# Patient Record
Sex: Female | Born: 1985 | Race: Black or African American | Hispanic: No | Marital: Married | Smoking: Current every day smoker
Health system: Southern US, Community
[De-identification: ages and names within clinical notes are randomized; demographics above are authoritative.]

## PROBLEM LIST (undated history)

## (undated) ENCOUNTER — Inpatient Hospital Stay (HOSPITAL_COMMUNITY): Payer: Self-pay

## (undated) DIAGNOSIS — O139 Gestational [pregnancy-induced] hypertension without significant proteinuria, unspecified trimester: Secondary | ICD-10-CM

## (undated) DIAGNOSIS — D573 Sickle-cell trait: Secondary | ICD-10-CM

## (undated) HISTORY — DX: Sickle-cell trait: D57.3

---
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
## 2004-07-21 HISTORY — PX: OVARIAN CYST REMOVAL: SHX89

## 2004-08-01 ENCOUNTER — Other Ambulatory Visit: Admission: RE | Admit: 2004-08-01 | Discharge: 2004-08-01 | Payer: Self-pay | Admitting: Obstetrics and Gynecology

## 2004-11-19 ENCOUNTER — Inpatient Hospital Stay (HOSPITAL_COMMUNITY): Admission: AD | Admit: 2004-11-19 | Discharge: 2004-11-19 | Payer: Self-pay | Admitting: Obstetrics and Gynecology

## 2004-12-03 ENCOUNTER — Ambulatory Visit: Payer: Self-pay | Admitting: Cardiovascular Disease

## 2004-12-12 ENCOUNTER — Inpatient Hospital Stay (HOSPITAL_COMMUNITY): Admission: AD | Admit: 2004-12-12 | Discharge: 2004-12-12 | Payer: Self-pay | Admitting: Obstetrics and Gynecology

## 2005-01-06 ENCOUNTER — Ambulatory Visit: Payer: Self-pay

## 2005-02-06 ENCOUNTER — Inpatient Hospital Stay (HOSPITAL_COMMUNITY): Admission: AD | Admit: 2005-02-06 | Discharge: 2005-02-10 | Payer: Self-pay | Admitting: Obstetrics and Gynecology

## 2005-02-07 ENCOUNTER — Encounter (INDEPENDENT_AMBULATORY_CARE_PROVIDER_SITE_OTHER): Payer: Self-pay | Admitting: *Deleted

## 2005-09-03 ENCOUNTER — Emergency Department (HOSPITAL_COMMUNITY): Admission: EM | Admit: 2005-09-03 | Discharge: 2005-09-03 | Payer: Self-pay | Admitting: Emergency Medicine

## 2005-11-27 ENCOUNTER — Emergency Department (HOSPITAL_COMMUNITY): Admission: EM | Admit: 2005-11-27 | Discharge: 2005-11-28 | Payer: Self-pay | Admitting: Emergency Medicine

## 2006-07-18 ENCOUNTER — Inpatient Hospital Stay (HOSPITAL_COMMUNITY): Admission: AD | Admit: 2006-07-18 | Discharge: 2006-07-18 | Payer: Self-pay | Admitting: Obstetrics and Gynecology

## 2007-09-08 ENCOUNTER — Emergency Department (HOSPITAL_COMMUNITY): Admission: EM | Admit: 2007-09-08 | Discharge: 2007-09-08 | Payer: Self-pay | Admitting: Emergency Medicine

## 2009-12-11 ENCOUNTER — Emergency Department (HOSPITAL_COMMUNITY): Admission: EM | Admit: 2009-12-11 | Discharge: 2009-12-11 | Payer: Self-pay | Admitting: Emergency Medicine

## 2010-02-04 ENCOUNTER — Emergency Department (HOSPITAL_COMMUNITY): Admission: EM | Admit: 2010-02-04 | Discharge: 2010-02-04 | Payer: Self-pay | Admitting: Emergency Medicine
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
## 2010-12-06 NOTE — Discharge Summary (Signed)
NAMEEDOM, SCHMUHL                  ACCOUNT NO.:  [id number]   MEDICAL RECORD NO.:  [id number]          PATIENT TYPE:  INP   LOCATION:  9374                          FACILITY:  [facility]   PHYSICIAN:  Crist Fat. Rivard, M.D. DATE OF BIRTH:  [birth year]   DATE OF ADMISSION:  02/06/2005  DATE OF DISCHARGE:  02/10/2005                                 DISCHARGE SUMMARY   ADMISSION DIAGNOSES:  1.  Intrauterine pregnancy at 36 weeks.  2.  Preeclampsia.  3.  Positive group B Strep.   DISCHARGE DIAGNOSES:  1.  Intrauterine pregnancy at 36 weeks.  2.  Preeclampsia.  3.  Positive group B Strep.  4.  Right adnexal cyst.  5.  Nonreassuring fetal heart rate tracing.   HOSPITAL PROCEDURES:  1.  Electronic fetal monitoring.  2.  Magnesium sulfate administration.  3.  Cervidil and Pitocin induction of labor.  4.  Epidural anesthesia.  5.  Primary low transverse cesarean section of a viable female infant.      Apgars 7 and 7.  6.  Removal of large right ovarian cyst.  7.  ICU monitoring.   HOSPITAL COURSE:  Patient was admitted for preeclampsia.  Cervidil was  placed due to unfavorable cervix.  She was later started on Pitocin and  given penicillin for group B Strep prophylaxis.  She developed decreased  variability on her fetal heart rate tracing.  Her cervix progressed only to  3 cm.  A decision was made to proceed with cesarean section for  nonreassuring fetal heart rate tracing.  This was performed under epidural  anesthesia by Dr. Normand Sloop.  A large right ovarian cyst approximately 8 cm  was also removed after the baby was delivered.  The baby was a female  infant, Apgars 7 and 7.  Cord pH was 7.32.  Baby was taken to the nursery,  mother taken to recovery in AICU.  On postoperative day #1 blood pressures  were 140-165/90-110.  She was given routine AICU care on magnesium sulfate.  Magnesium sulfate was discontinued on postoperative day #2.  Her laboratory  values improved.  Her blood  pressures were 130s/70s-80s and she continued to  receive routine care.  Magnesium sulfate was discontinued that evening.  On  postoperative day #3 patient was requesting to go home.  Blood pressures  were 132-155/90-108 on 100 mg of labetalol twice per day.  Weight was 204  reflecting no diuresis yet.  White blood cell count 11.2, hemoglobin 7.4.  Platelets were 124 which is up from 93.  Oxygen saturations were 93-100%.  Intake and output revealed diuresis of approximately 2 L.  DTRs were 1+.  Edema was 2+ and her other laboratory values were within normal limits.  Patient was deemed to have received the full benefit of her hospital stay  and was discharged home.   DISCHARGE MEDICATIONS:  1.  Motrin 600 mg p.o. q.6h. p.r.n.  2.  Tylox one to two p.o. q.4h. p.r.n.  3.  Labetalol will be prescribed prior to discharge, but dosage to be      determined  by Dr. Estanislado Pandy.   DISCHARGE INSTRUCTIONS:  Per [facility] handout.   DICSHARGE FOLLOW-UP:  In one week for blood pressure check.   DISCHARGE LABORATORIES:  White blood cell count 11.2, hemoglobin 7.4,  platelets 124.  Sodium 138, potassium 4, creatinine 1.2.  SGOT 30, SGPT 18,  uric acid 8.4.   CONDITION ON DISCHARGE:  Good.       MLW/MEDQ  D:  02/10/2005  T:  02/10/2005  Job:  [id number]

## 2010-12-06 NOTE — Discharge Summary (Signed)
   NAMECASH, MEADOW NO.:  [id number]   MEDICAL RECORD NO.:  [id number]                   PATIENT TYPE:  EMS   LOCATION:  ED                                   FACILITY:  [facility]   PHYSICIAN:  Danae Orleans. Venetia Maxon, M.D.               DATE OF BIRTH:  09-Aug-1985   DATE OF ADMISSION:  05/02/2002  DATE OF DISCHARGE:  05/03/2002                                 DISCHARGE SUMMARY   DEATH SUMMARY:  Dictation ended at this point.                                                Danae Orleans. Venetia Maxon, M.D.    JDS/MEDQ  D:  05/20/2002  T:  05/22/2002  Job:  [id number]

## 2010-12-06 NOTE — Op Note (Signed)
[name], [name]                  ACCOUNT NO.:  [id number]   MEDICAL RECORD NO.:  [id number]          PATIENT TYPE:  INP   LOCATION:  9374                          [facility]   PHYSICIAN:  [name], M.D. DATE OF BIRTH:  [birth year]   DATE OF PROCEDURE:  02/07/2005  DATE OF DISCHARGE:                                 OPERATIVE REPORT   PREOPERATIVE DIAGNOSES:  1.  Intrauterine pregnancy at 36 weeks.  2.  Preeclampsia.  3.  Right ovarian cyst.  4.  Nonreassuring fetal heart tones.   POSTOPERATIVE DIAGNOSES:  1.  Intrauterine pregnancy at 36 weeks.  2.  Preeclampsia.  3.  Right ovarian cyst.  4.  Nonreassuring fetal heart tones.   OPERATION/PROCEDURE:  1.  Right ovarian cystectomy.  2.  Lysis of adhesions.  3.  Primary lower transverse cesarean section.   SURGEON:  Ann A. Normand Sloop, M.D.   ASSISTANT:  Elby Showers. Williams, C.N.M.   ESTIMATED BLOOD LOSS:  800 mL.   IV FLUIDS:  1500 mL crystalloids.   URINARY OUTPUT:  500 mL clear urine at the procedure.   FINDINGS:  Female infant, vertex presentation with Apgars of 7 and 7 and  cord PH of 7.32.  Normal-appearing appendix.  Large right simple ovarian  cyst.  Normal-appearing right ovary.  Normal appearing left tube and ovary.  There were no complications.  The patient went to the recovery room in good  condition.   DESCRIPTION OF PROCEDURE:  The patient was taken to the operating room where  she had anesthesia by epidural, where her epidural was found to be adequate.  She was prepped and draped in the normal sterile fashion.  A Pfannenstiel  skin incision was made with the scalpel and carried down to the fascia using  Bovie cautery.  The fascia was incised in the midline and extended  bilaterally using Mayo scissors and pickups with teeth.  Kochers x2 were  placed on the superior aspect of the fascia which was dissected off the  rectus muscles both sharply and bluntly in the inferior aspect of the fascia  was  dissected  in a similar fashion.  The rectus muscle was separated.  The  peritoneum was identified and cavity entered sharply.  The bladder blade was  inserted.  The vesicouterine peritoneum was identified, tented up and  entered sharply and extended bilaterally.  Bladder blade was reinserted.  The primary lower transverse uterine incision was made with the scalpel and  extended  bilaterally transversely bilaterally bluntly.  The infant was  delivered without difficulty.  Cord gas was obtained.  The placenta was  delivered intact and sent to pathology.  The uterus was cleared of clot and  debris.  Uterine incision was repaired with 0 Vicryl in a running locked  fashion, second layer with 0 Vicryl was used to imbricate the uterus.  The  uterus was then exteriorized and the right ovary was adherent to the  posterior of the uterus with adhesions.  Lysis of adhesions were done with  Metzenbaum scissors.  The right cyst wall was  then opened and drained. Clear  straw fluid was noted.  Most of the cyst was removed and the cyst wall was  removed from the ovarian tissue.  Before the cyst was removed, however, the  fallopian tube was adherent and the fallopian tube was dissected away from  the cyst.  The cyst wall was removed from the remaining ovarian tissue.  The  ovarian tissue was made hemostatic using 3-0 Vicryl in a running locked  fashion.  Irrigation was done.  Hemostasis was assured.  Uterus was returned  back to the abdomen.  Irrigation was done once again.  All instruments were  removed from the abdomen. The peritoneum was closed with 0 chromic.  The  fascia was closed with 0 Vicryl in a running fashion.  Subcutaneous tissue  was made hemostatic with Bovie cautery.  Jackson-Pratt drain was placed.  Skin was closed with staples.  Sponge, lab and needle counts were correct  x2.  The patient was taken to the recovery room in stable condition.       NAD/MEDQ  D:  02/07/2005  T:  02/07/2005   Job:  [id number]

## 2011-07-22 ENCOUNTER — Inpatient Hospital Stay (HOSPITAL_COMMUNITY): Payer: Medicaid Other

## 2011-07-22 ENCOUNTER — Inpatient Hospital Stay (HOSPITAL_COMMUNITY)
Admission: AD | Admit: 2011-07-22 | Discharge: 2011-07-22 | Disposition: A | Discharge status: Home / Self Care | Payer: Medicaid Other | Source: Ambulatory Visit | Attending: Obstetrics & Gynecology | Admitting: Obstetrics & Gynecology

## 2011-07-22 ENCOUNTER — Encounter (HOSPITAL_COMMUNITY): Payer: Self-pay

## 2011-07-22 DIAGNOSIS — O26859 Spotting complicating pregnancy, unspecified trimester: Secondary | ICD-10-CM | POA: Insufficient documentation

## 2011-07-22 DIAGNOSIS — R1031 Right lower quadrant pain: Secondary | ICD-10-CM | POA: Insufficient documentation

## 2011-07-22 DIAGNOSIS — O2390 Unspecified genitourinary tract infection in pregnancy, unspecified trimester: Secondary | ICD-10-CM | POA: Insufficient documentation

## 2011-07-22 DIAGNOSIS — N760 Acute vaginitis: Secondary | ICD-10-CM | POA: Insufficient documentation

## 2011-07-22 DIAGNOSIS — O4690 Antepartum hemorrhage, unspecified, unspecified trimester: Secondary | ICD-10-CM

## 2011-07-22 DIAGNOSIS — O26899 Other specified pregnancy related conditions, unspecified trimester: Secondary | ICD-10-CM

## 2011-07-22 DIAGNOSIS — B9689 Other specified bacterial agents as the cause of diseases classified elsewhere: Secondary | ICD-10-CM | POA: Insufficient documentation

## 2011-07-22 DIAGNOSIS — A499 Bacterial infection, unspecified: Secondary | ICD-10-CM | POA: Insufficient documentation

## 2011-07-22 HISTORY — DX: Gestational (pregnancy-induced) hypertension without significant proteinuria, unspecified trimester: O13.9

## 2011-07-22 LAB — CBC
HCT: 31.3 % — ABNORMAL LOW (ref 36.0–46.0)
MCH: 23.3 pg — ABNORMAL LOW (ref 26.0–34.0)
MCV: 73 fL — ABNORMAL LOW (ref 78.0–100.0)
WBC: 12.2 10*3/uL — ABNORMAL HIGH (ref 4.0–10.5)

## 2011-07-22 LAB — URINALYSIS, ROUTINE W REFLEX MICROSCOPIC
Leukocytes, UA: NEGATIVE
Nitrite: NEGATIVE
Protein, ur: NEGATIVE mg/dL
Urobilinogen, UA: 0.2 mg/dL (ref 0.0–1.0)

## 2011-07-22 LAB — WET PREP, GENITAL
Trich, Wet Prep: NONE SEEN
Yeast Wet Prep HPF POC: NONE SEEN

## 2011-07-22 MED ORDER — METRONIDAZOLE 500 MG PO TABS: 500.0000 mg | ORAL_TABLET | Freq: Two times a day (BID) | ORAL | Status: AC

## 2011-07-22 NOTE — ED Provider Notes (Signed)
[name] [id number] @[redacted]w[redacted]d  by LMP confirmed by outside [place] Chief Complaint  Patient presents with  . Vaginal Bleeding    SUBJECTIVE  HPI: Onset yesterday of lower abd intermittent cramping and bright red vaginal spotting seen every time she wipes since then. No leakage of fluid or vaginal bleeding. Good fetal movement. Cramping is more on the left side and worse with movement and walking. She's had no prior episodes of bleeding or spotting. Denies any substance abuse. No recent intercourse. No irritative vaginitis. No dysuria, hematuria, urgency, frequency. States her blood type is O+. Her 18 week ultrasound was all normal.Receives care and [place] but has moved back to [place] and would like to initiate care here.  Past Medical History  Diagnosis Date  . Pregnancy induced hypertension    Past Surgical History  Procedure Date  . Ovarian cyst removal 2006  . Cesarean section    History   Social History  . Marital Status: Single    Spouse Name: N/A    Number of Children: N/A  . Years of Education: N/A   Occupational History  . Not on file.   Social History Main Topics  . Smoking status: Former Games developer  . Smokeless tobacco: Not on file  . Alcohol Use: No  . Drug Use: No  . Sexually Active: Not Currently   Other Topics Concern  . Not on file   Social History Narrative  . No narrative on file   No current facility-administered medications on file prior to encounter.   No current outpatient prescriptions on file prior to encounter.   No Known Allergies  ROS: Pertinent items in HPI  OBJECTIVE  BP 105/60  Pulse 67  Temp(Src) 98.4 F (36.9 C) (Oral)  Resp 16  Ht 5\' 3"  (1.6 m)  Wt 64.501 kg (142 lb 3.2 oz)  BMI 25.19 kg/m2  SpO2 99%  LMP 02/07/2011  Physical Exam  Constitutional: She is well-developed, well-nourished, and in no distress. No distress.  HENT:  Head: Normocephalic.  Eyes: EOM are normal.  Neck: Neck supple.  Cardiovascular:  Normal rate.   Pulmonary/Chest: Effort normal.  Abdominal: Soft. There is no tenderness.       Consistent with [redacted] wk gestation  Genitourinary: Vagina normal and cervix normal. No vaginal discharge found.       SSE: scant brownish discharge. Cervix appears clean. No active bleeding VE: closed/30%/-3 breech    FHR 135-140 with moderate variabilty, no decelerations, 5-10 bpm accelerations Toco: no contractions Results for orders placed during the hospital encounter of 07/22/11 (from the past 24 hour(s))  URINALYSIS, ROUTINE W REFLEX MICROSCOPIC     Status: Normal   Collection Time   07/22/11  4:25 PM      Component Value Range   Color, Urine YELLOW  YELLOW    APPearance CLEAR  CLEAR    Specific Gravity, Urine 1.010  1.005 - 1.030    pH 7.0  5.0 - 8.0    Glucose, UA NEGATIVE  NEGATIVE (mg/dL)   Hgb urine dipstick NEGATIVE  NEGATIVE    Bilirubin Urine NEGATIVE  NEGATIVE    Ketones, ur NEGATIVE  NEGATIVE (mg/dL)   Protein, ur NEGATIVE  NEGATIVE (mg/dL)   Urobilinogen, UA 0.2  0.0 - 1.0 (mg/dL)   Nitrite NEGATIVE  NEGATIVE    Leukocytes, UA NEGATIVE  NEGATIVE   WET PREP, GENITAL     Status: Abnormal   Collection Time   07/22/11  5:05 PM  Component Value Range   Yeast, Wet Prep NONE SEEN  NONE SEEN    Trich, Wet Prep NONE SEEN  NONE SEEN    Clue Cells, Wet Prep MODERATE (*) NONE SEEN    WBC, Wet Prep HPF POC FEW (*) NONE SEEN   CBC     Status: Abnormal   Collection Time   07/22/11  5:15 PM      Component Value Range   WBC 12.2 (*) 4.0 - 10.5 (K/uL)   RBC 4.29  3.87 - 5.11 (MIL/uL)   Hemoglobin 10.0 (*) 12.0 - 15.0 (g/dL)   HCT 40.9 (*) 81.1 - 46.0 (%)   MCV 73.0 (*) 78.0 - 100.0 (fL)   MCH 23.3 (*) 26.0 - 34.0 (pg)   MCHC 31.9  30.0 - 36.0 (g/dL)   RDW 91.4  78.2 - 95.6 (%)   Platelets 189  150 - 400 (K/uL)   Complete  US: all WNL, cx 3.6   ASSESSMENT  [id number] at 24 wks  Scant vaginal spotting  Reassuring fetal parameters BV RLP GC/CT pending   PLAN ROI records  received: [place] dating anad O pos confirmed. Will arrange F/U [facility] D/W Dr. Marice Potter. Home with precautions to return for BRB, increased pain, decreased FM

## 2011-07-22 NOTE — Progress Notes (Signed)
Patient states lower left abdominal cramping and lower back intermittent pain since last night. 2 episodes of vomiting this morning after midnight. Bright red vaginal spotting since yesterday only sees when wipes after using the bathroom. Denies leaking of fluid. Reports positive fetal movement.

## 2011-07-22 NOTE — Progress Notes (Signed)
Patient states she started having left lower abdominal pain last night. Has occasional sharp pain shooting down into vagina. Has had pinkish spotting since 12-31. Reports no leaking and good fetal movement.

## 2011-07-22 NOTE — L&D Delivery Note (Signed)
Cesarean Section Procedure Note  Indications: failure to progress: arrest of dilation and non-reassuring fetal status  Pre-operative Diagnosis: 39 week 2 day pregnancy.  Post-operative Diagnosis: same  Surgeon: Purcell Nails   Assistants: Lavera Guise, CNM  Anesthesia: Epidural anesthesia  Procedure Details   The patient was seen in the Holding Room. The risks, benefits, complications, treatment options, and expected outcomes were discussed with the patient.  The patient concurred with the proposed plan, giving informed consent.  The site of surgery properly noted/marked. The patient was taken to Operating Room # 1, identified as [name] and the procedure verified as C-Section Delivery. A Time Out was held and the above information confirmed.  After induction of anesthesia, the patient was draped and prepped in the usual sterile manner. A Pfannenstiel incision was made and carried down through the subcutaneous tissue to the fascia. Fascial incision was made and extended transversely. The fascia was separated from the underlying rectus tissue superiorly and inferiorly. The peritoneum was identified and entered. Peritoneal incision was extended longitudinally. The utero-vesical peritoneal reflection was incised transversely and the bladder flap was bluntly freed from the lower uterine segment. A low transverse uterine incision was made. Infant was delivered from cephalic, LOP presentation with Apgar scores of 9 at one minute and 9 at five minutes. After the umbilical cord was clamped and cut cord blood was obtained for evaluation. The placenta was removed intact and appeared normal. The uterine outline, tubes and ovaries appeared normal. The uterine incision was closed with running locked sutures of 0 Vicryl. A second imbricating layer was performed.  Adhesions of the right fallopian tube to the uterine wall were excised.  Hemostasis was observed. Lavage was carried out until clear. The  fascia was then reapproximated with running sutures of 0- Vicryl. J-P drain was placed and sutured down with 2-0 silk. The skin was reapproximated with 3.0 Monocryl via subcuticular stitch.  Instrument, sponge, and needle counts were correct prior the abdominal closure and at the conclusion of the case.   Findings: Normal appearing bilateral ovaries and tubes  Estimated Blood Loss:  700 cc         Drains: foley to gravity 100 cc         Total IV Fluids:  2200 ml         Specimens: Placenta         Complications:  None; patient tolerated the procedure well.         Disposition: PACU - hemodynamically stable.         Condition: stable  Attending Attestation: I was present and scrubbed for the entire procedure.

## 2011-07-24 LAB — GC/CHLAMYDIA PROBE AMP, GENITAL
Chlamydia, DNA Probe: NEGATIVE
GC Probe Amp, Genital: NEGATIVE

## 2011-08-13 ENCOUNTER — Other Ambulatory Visit: Payer: Self-pay

## 2011-09-05 ENCOUNTER — Encounter (HOSPITAL_COMMUNITY): Payer: Self-pay

## 2011-09-05 ENCOUNTER — Inpatient Hospital Stay (HOSPITAL_COMMUNITY)
Admission: AD | Admit: 2011-09-05 | Discharge: 2011-09-05 | Disposition: A | Discharge status: Home / Self Care | Payer: Medicaid Other | Source: Ambulatory Visit | Attending: Obstetrics and Gynecology | Admitting: Obstetrics and Gynecology

## 2011-09-05 DIAGNOSIS — O4700 False labor before 37 completed weeks of gestation, unspecified trimester: Secondary | ICD-10-CM | POA: Insufficient documentation

## 2011-09-05 DIAGNOSIS — K089 Disorder of teeth and supporting structures, unspecified: Secondary | ICD-10-CM | POA: Insufficient documentation

## 2011-09-05 DIAGNOSIS — R0602 Shortness of breath: Secondary | ICD-10-CM

## 2011-09-05 DIAGNOSIS — K0889 Other specified disorders of teeth and supporting structures: Secondary | ICD-10-CM

## 2011-09-05 DIAGNOSIS — O99891 Other specified diseases and conditions complicating pregnancy: Secondary | ICD-10-CM | POA: Insufficient documentation

## 2011-09-05 DIAGNOSIS — O368190 Decreased fetal movements, unspecified trimester, not applicable or unspecified: Secondary | ICD-10-CM | POA: Insufficient documentation

## 2011-09-05 LAB — URINALYSIS, ROUTINE W REFLEX MICROSCOPIC
Hgb urine dipstick: NEGATIVE
Leukocytes, UA: NEGATIVE
Nitrite: NEGATIVE
Protein, ur: NEGATIVE mg/dL
Urobilinogen, UA: 0.2 mg/dL (ref 0.0–1.0)

## 2011-09-05 MED ORDER — ACETAMINOPHEN-CODEINE 300-60 MG PO TABS: 1.0000 | ORAL_TABLET | ORAL | Status: AC | PRN

## 2011-09-05 NOTE — Discharge Instructions (Signed)
Dental Pain A tooth ache may be caused by cavities (tooth decay). Cavities expose the nerve of the tooth to air and hot or cold temperatures. It may come from an infection or abscess (also called a boil or furuncle) around your tooth. It is also often caused by dental caries (tooth decay). This causes the pain you are having. DIAGNOSIS  Your caregiver can diagnose this problem by exam. TREATMENT   If caused by an infection, it may be treated with medications which kill germs (antibiotics) and pain medications as prescribed by your caregiver. Take medications as directed.   Only take over-the-counter or prescription medicines for pain, discomfort, or fever as directed by your caregiver.   Whether the tooth ache today is caused by infection or dental disease, you should see your dentist as soon as possible for further care.  SEEK MEDICAL CARE IF: The exam and treatment you received today has been provided on an emergency basis only. This is not a substitute for complete medical or dental care. If your problem worsens or new problems (symptoms) appear, and you are unable to meet with your dentist, call or return to this location. SEEK IMMEDIATE MEDICAL CARE IF:   You have a fever.   You develop redness and swelling of your face, jaw, or neck.   You are unable to open your mouth.   You have severe pain uncontrolled by pain medicine.  MAKE SURE YOU:   Understand these instructions.   Will watch your condition.   Will get help right away if you are not doing well or get worse.  Document Released: 07/07/2005 Document Revised: 03/19/2011 Document Reviewed: 02/23/2008 [facility] Patient Information 2012 [place], [place]. Preterm Labor Preterm labor is when labor starts at less than 37 weeks of pregnancy. The normal length of a pregnancy is 39 to 41 weeks. CAUSES Often, there is no identifiable underlying cause as to why a woman goes into preterm labor. However, one of the most common known  causes of preterm labor is infection. Infections of the uterus, cervix, vagina, amniotic sac, bladder, kidney, or even the lungs (pneumonia) can cause labor to start. Other causes of preterm labor include:  Urogenital infections, such as yeast infections and bacterial vaginosis.   Uterine abnormalities (uterine shape, uterine septum, fibroids, bleeding from the placenta).   A cervix that has been operated on and opens prematurely.   Malformations in the baby.   Multiple gestations (twins, triplets, and so on).   Breakage of the amniotic sac.  Additional risk factors for preterm labor include:  Previous history of preterm labor.   Premature rupture of membranes (PROM).   A placenta that covers the opening of the cervix (placenta previa).   A placenta that separates from the uterus (placenta abruption).   A cervix that is too weak to hold the baby in the uterus (incompetence cervix).   Having too much fluid in the amniotic sac (polyhydramnios).   Taking illegal drugs or smoking while pregnant.   Not gaining enough weight while pregnant.   Women younger than 13 and older than 27 years old.   Low socioeconomic status.   African-American ethnicity.  SYMPTOMS Signs and symptoms of preterm labor include:  Menstrual-like cramps.   Contractions that are 30 to 70 seconds apart, become very regular, closer together, and are more intense and painful.   Contractions that start on the top of the uterus and spread down to the lower abdomen and back.   A sense of increased pelvic  pressure or back pain.   A watery or bloody discharge that comes from the vagina.  DIAGNOSIS  A diagnosis can be confirmed by:  A vaginal exam.   An ultrasound of the cervix.   Sampling (swabbing) cervico-vaginal secretions. These samples can be tested for the presence of fetal fibronectin. This is a protein found in cervical discharge which is associated with preterm labor.   Fetal monitoring.    TREATMENT  Depending on the length of the pregnancy and other circumstances, a caregiver may suggest bed rest. If necessary, there are medicines that can be given to stop contractions and to quicken fetal lung maturity. If labor happens before 34 weeks of pregnancy, a prolonged hospital stay may be recommended. Treatment depends on the condition of both the mother and baby. PREVENTION There are some things a mother can do to lower the risk of preterm labor in future pregnancies. A woman can:   Stop smoking.   Maintain healthy weight gain and avoid chemicals and drugs that are not necessary.   Be watchful for any type of infection.   Inform her caregiver if she has a known history of preterm labor.  Document Released: 09/27/2003 Document Revised: 03/19/2011 Document Reviewed: 11/01/2010 [facility] Patient Information 2012 [place], [place].

## 2011-09-05 NOTE — Progress Notes (Signed)
History    p Chief Complaint  Patient presents with  . Shortness of Breath  presents with c/o of shortness of breath earlier and felt dizzy at home, hx of asthma no inhaler use x 15 years, some toothache no dentist will see her with medicaid. Decreased fetal movement. Denies vaginal bleeding or srom, feeling some contractions more today @SFHPI @  OB History    Grav Para Term Preterm Abortions TAB SAB Ect Mult Living   4 1 0 1 2 1 1 0 0 1       Past Medical History  Diagnosis Date  . Pregnancy induced hypertension   . Asthma     last attack >15 yrs ago    Past Surgical History  Procedure Date  . Ovarian cyst removal 2006  . Cesarean section     Family History  Problem Relation Age of Onset  . Asthma Mother   . Hypertension Maternal Grandmother   . Stroke Maternal Grandmother   . Anesthesia problems Neg Hx     History  Substance Use Topics  . Smoking status: Former Games developer  . Smokeless tobacco: Not on file  . Alcohol Use: No    Allergies: No Known Allergies  Prescriptions prior to admission  Medication Sig Dispense Refill  . acetaminophen (TYLENOL) 325 MG tablet Take 325 mg by mouth every 6 (six) hours as needed. pain       . Prenatal Vit-Fe Fumarate-FA (PRENATAL MULTIVITAMIN) TABS Take 1 tablet by mouth daily.          @ROS @ Physical Exam   Blood pressure 106/78, pulse 119, temperature 97.8 F (36.6 C), temperature source Oral, resp. rate 20, height 5\' 1"  (1.549 m), weight 68.947 kg (152 lb), last menstrual period 02/07/2011, SpO2 100.00%.  resting quietly in bed, no distress easy respirations, lungs clear bilaterally, AP regular, abd soft, gravid, nt, EGBUS WNL and cervix LTC high FFN and UA pending. A 30 3/7 weeks      P awaiting results, po hydration, dental referral to [facility]. Lavera Guise, CNM Addendum: uc resolved, neg FFN, neg Urinalysis, RX tylenol #3 for toothache and # to call for dental care  medicaid. Collaboration with Dr. Pennie Rushing. MK

## 2011-09-05 NOTE — Progress Notes (Signed)
Was having shortness breath and chest pain.  Pelvic pain, and almost fainted - about 2 hrs.  Felt like pulse was going really fast. Still feels SOB and slight pelvic pressure /pain.

## 2011-09-10 LAB — OB RESULTS CONSOLE HIV ANTIBODY (ROUTINE TESTING): HIV: NONREACTIVE

## 2011-09-10 LAB — RUBELLA ANTIBODY, IGM: Rubella: IMMUNE

## 2011-09-10 LAB — ANTIBODY SCREEN: Antibody Screen: NEGATIVE

## 2011-09-10 LAB — HIV ANTIBODY (ROUTINE TESTING W REFLEX): HIV: NONREACTIVE

## 2011-09-10 LAB — HEPATITIS B SURFACE ANTIGEN: Hepatitis B Surface Ag: NEGATIVE

## 2011-09-19 ENCOUNTER — Encounter (INDEPENDENT_AMBULATORY_CARE_PROVIDER_SITE_OTHER): Payer: Medicaid Other

## 2011-09-19 DIAGNOSIS — Z331 Pregnant state, incidental: Secondary | ICD-10-CM

## 2011-10-06 ENCOUNTER — Encounter (INDEPENDENT_AMBULATORY_CARE_PROVIDER_SITE_OTHER): Payer: Medicaid Other | Admitting: Obstetrics and Gynecology

## 2011-10-06 DIAGNOSIS — Z331 Pregnant state, incidental: Secondary | ICD-10-CM

## 2011-10-07 DIAGNOSIS — Z331 Pregnant state, incidental: Secondary | ICD-10-CM

## 2011-10-14 DIAGNOSIS — Z331 Pregnant state, incidental: Secondary | ICD-10-CM

## 2011-10-14 DIAGNOSIS — O34219 Maternal care for unspecified type scar from previous cesarean delivery: Secondary | ICD-10-CM

## 2011-10-15 ENCOUNTER — Encounter (INDEPENDENT_AMBULATORY_CARE_PROVIDER_SITE_OTHER): Payer: Medicaid Other | Admitting: Obstetrics and Gynecology

## 2011-10-15 DIAGNOSIS — Z3480 Encounter for supervision of other normal pregnancy, unspecified trimester: Secondary | ICD-10-CM

## 2011-10-15 LAB — OB RESULTS CONSOLE GBS: GBS: NEGATIVE

## 2011-10-21 ENCOUNTER — Encounter (INDEPENDENT_AMBULATORY_CARE_PROVIDER_SITE_OTHER): Payer: Medicaid Other | Admitting: Registered Nurse

## 2011-10-21 DIAGNOSIS — N949 Unspecified condition associated with female genital organs and menstrual cycle: Secondary | ICD-10-CM

## 2011-10-21 DIAGNOSIS — Z331 Pregnant state, incidental: Secondary | ICD-10-CM

## 2011-10-24 ENCOUNTER — Encounter: Payer: Medicaid Other | Admitting: Obstetrics and Gynecology

## 2011-10-24 ENCOUNTER — Encounter: Payer: Medicaid Other | Admitting: Registered Nurse

## 2011-10-31 ENCOUNTER — Encounter: Payer: Self-pay | Admitting: Obstetrics and Gynecology

## 2011-10-31 ENCOUNTER — Ambulatory Visit (INDEPENDENT_AMBULATORY_CARE_PROVIDER_SITE_OTHER): Payer: Medicaid Other | Admitting: Obstetrics and Gynecology

## 2011-10-31 VITALS — BP 114/72 | Wt 184.0 lb

## 2011-10-31 DIAGNOSIS — Z9889 Other specified postprocedural states: Secondary | ICD-10-CM

## 2011-10-31 DIAGNOSIS — Z98891 History of uterine scar from previous surgery: Secondary | ICD-10-CM | POA: Insufficient documentation

## 2011-10-31 DIAGNOSIS — Z331 Pregnant state, incidental: Secondary | ICD-10-CM

## 2011-10-31 NOTE — Progress Notes (Signed)
Pt c/o headaches ,blurred vision and some dizziness no relief with tylenol

## 2011-10-31 NOTE — Patient Instructions (Signed)
Patient Education Materials to be provided at check out (*indicates is located in accordion folder):  *Nexplanon 

## 2011-10-31 NOTE — Progress Notes (Signed)
A/P GBS negative Fetal kick counts reviewed Labor reviewed with pt All patients  questions answered vbac consent signed today

## 2011-11-06 DIAGNOSIS — O4690 Antepartum hemorrhage, unspecified, unspecified trimester: Secondary | ICD-10-CM

## 2011-11-06 DIAGNOSIS — Z98891 History of uterine scar from previous surgery: Secondary | ICD-10-CM

## 2011-11-07 ENCOUNTER — Ambulatory Visit (INDEPENDENT_AMBULATORY_CARE_PROVIDER_SITE_OTHER): Payer: Medicaid Other | Admitting: Obstetrics and Gynecology

## 2011-11-07 ENCOUNTER — Encounter: Payer: Self-pay | Admitting: Obstetrics and Gynecology

## 2011-11-07 VITALS — BP 112/64 | Wt 187.0 lb

## 2011-11-07 DIAGNOSIS — Z331 Pregnant state, incidental: Secondary | ICD-10-CM

## 2011-11-07 NOTE — Progress Notes (Signed)
No complaints

## 2011-11-07 NOTE — Progress Notes (Signed)
Pt.stated no issues today. Pt wants  cervix check today.

## 2011-11-08 ENCOUNTER — Encounter (HOSPITAL_COMMUNITY): Payer: Self-pay | Admitting: *Deleted

## 2011-11-08 ENCOUNTER — Inpatient Hospital Stay (HOSPITAL_COMMUNITY)
Admission: AD | Admit: 2011-11-08 | Discharge: 2011-11-08 | Disposition: A | Discharge status: Home / Self Care | Payer: Medicaid Other | Source: Ambulatory Visit | Attending: Obstetrics and Gynecology | Admitting: Obstetrics and Gynecology

## 2011-11-08 DIAGNOSIS — O479 False labor, unspecified: Secondary | ICD-10-CM

## 2011-11-08 DIAGNOSIS — O368190 Decreased fetal movements, unspecified trimester, not applicable or unspecified: Secondary | ICD-10-CM | POA: Insufficient documentation

## 2011-11-08 DIAGNOSIS — O4700 False labor before 37 completed weeks of gestation, unspecified trimester: Secondary | ICD-10-CM | POA: Insufficient documentation

## 2011-11-08 DIAGNOSIS — O4690 Antepartum hemorrhage, unspecified, unspecified trimester: Secondary | ICD-10-CM

## 2011-11-08 MED ORDER — ZOLPIDEM TARTRATE 10 MG PO TABS: 10.0000 mg | ORAL_TABLET | Freq: Every evening | ORAL | Status: DC | PRN

## 2011-11-08 NOTE — MAU Note (Signed)
Pt returned to EFM, PO hydration started.

## 2011-11-08 NOTE — Discharge Instructions (Signed)
Most women go into labor on their own between 60 and 42 weeks of the pregnancy. When this does not happen or when there is a medical need, medicine or other methods may be used to induce labor. Labor induction causes a pregnant woman's uterus to contract. It also causes the cervix to soften (ripen), open (dilate), and thin out (efface). Usually, labor is not induced before 39 weeks of the pregnancy unless there is a problem with the baby or mother. Whether your labor will be induced depends on a number of factors, including the following:  The medical condition of you and the baby.   How many weeks along you are.   The status of baby's lung maturity.   The condition of the cervix.   The position of the baby.  REASONS FOR LABOR INDUCTION  The health of the baby or mother is at risk.   The pregnancy is overdue by 1 week or more.   The water breaks but labor does not start on its own.   The mother has a health condition or serious illness such as high blood pressure, infection, placental abruption, or diabetes.   The amniotic fluid amounts are low around the baby.   The baby is distressed.  REASONS TO NOT INDUCE LABOR Labor induction may not be a good idea if:  It is shown that your baby does not tolerate labor.   An induction is just more convenient.   You want the baby to be born on a certain date, like a holiday.   You have had previous surgeries on your uterus, such as a myomectomy or the removal of fibroids.   Your placenta lies very low in the uterus and blocks the opening of the cervix (placenta previa).   Your baby is not in a head down position.   The umbilical cord drops down into the birth canal in front of the baby. This could cut off the baby's blood and oxygen supply.   You have had a previous cesarean delivery.   There areunusual circumstances, such as the baby being extremely premature.  RISKS AND COMPLICATIONS Problems may occur in the process of  induction and plans may need to be modified as a situation unfolds. Some of the risks of induction include:  Change in fetal heart rate, such as too high, too low, or erratic.   Risk of fetal distress.   Risk of infection to mother and baby.   Increased chance of having a cesarean delivery.   The rare, but increased chance that the placenta will separate from the uterus (abruption).   Uterine rupture (very rare).  When induction is needed for medical reasons, the benefits of induction may outweigh the risks. BEFORE THE PROCEDURE Your caregiver will check your cervix and the baby's position. This will help your caregiver decide if you are far enough along for an induction to work. PROCEDURE Several methods of labor induction may be used, such as:   Taking prostaglandin medicine to dilate and ripen the cervix. The medicine will also start contractions. It can be taken by mouth or by inserting a suppository into the vagina.   A thin tube (catheter) with a balloon on the end may be inserted into your vagina to dilate the cervix. Once inserted, the balloon expands with water, which causes the cervix to open.   Striping the membranes. Your caregiver inserts a finger between the cervix and membranes, which causes the cervix to be stretched and may cause the  uterus to contract. This is often done during an office visit. You will be sent home to wait for the contractions to begin. You will then come in for an induction.   Breaking the water. Your caregiver will make a hole in the amniotic sac using a small instrument. Once the amniotic sac breaks, contractions should begin. This may still take hours to see an effect.   Taking medicine to trigger or strengthen contractions. This medicine is given intravenously through a tube in your arm.  All of the methods of induction, besides stripping the membranes, will be done in the hospital. Induction is done in the hospital so that you and the baby can be  carefully monitored. AFTER THE PROCEDURE Some inductions can take up to 2 or 3 days. Depending on the cervix, it usually takes less time. It takes longer when you are induced early in the pregnancy or if this is your first pregnancy. If a mother is still pregnant and the induction has been going on for 2 to 3 days, either the mother will be sent home or a cesarean delivery will be needed. Document Released: 11/26/2006 Document Revised: 06/26/2011 Document Reviewed: 05/12/2011 [facility] Patient Information 2012 [place], [place].

## 2011-11-08 NOTE — MAU Note (Signed)
Melina Fiddler, CNM in with patient, EFM off will discharge to home

## 2011-11-08 NOTE — MAU Note (Signed)
[name], CNM in with pt. Plan: drink pitcher of water and discharge to home after decreased Ucs.

## 2011-11-08 NOTE — Progress Notes (Signed)
History   26 yo g2p1 EDC 4/26 presents with c/o of uc since last pm, denies srom, vag bleeding, with +FM.  Chief Complaint  Patient presents with  . Decreased Fetal Movement  . Contractions   @SFHPI @  OB History    Grav Para Term Preterm Abortions TAB SAB Ect Mult Living   4 1 0 1 2 1 1 0 0 1       Past Medical History  Diagnosis Date  . Pregnancy induced hypertension   . Asthma     last attack >15 yrs ago  . Sickle cell trait   . Preterm labor     Past Surgical History  Procedure Date  . Ovarian cyst removal 2006  . Cesarean section   35 week PIH C/S  Family History  Problem Relation Age of Onset  . Asthma Mother   . Mental illness Mother     bipolar  . Drug abuse Mother   . Hypertension Maternal Grandmother   . Stroke Maternal Grandmother   . Asthma Maternal Grandmother   . Diabetes Maternal Grandmother   . Cancer Maternal Grandmother     lung  . Anesthesia problems Neg Hx     History  Substance Use Topics  . Smoking status: Never Smoker   . Smokeless tobacco: Never Used  . Alcohol Use: No    Allergies: No Known Allergies  Prescriptions prior to admission  Medication Sig Dispense Refill  . acetaminophen (TYLENOL) 325 MG tablet Take 325 mg by mouth every 6 (six) hours as needed. pain       . Prenatal Vit-Fe Fumarate-FA (PRENATAL MULTIVITAMIN) TABS Take 1 tablet by mouth daily.          @ROS @ Physical Exam  abd soft, gravid, nt Vag 1 70 -2 VTX posterior  Blood pressure 130/78, pulse 65, temperature 98.3 F (36.8 C), temperature source Oral, resp. rate 18, last menstrual period 02/07/2011.  39 1/7 week IUP not in active labor Hx prior c/s plans VBAC P reassess in 2 hours if not in labor home with po sedation, s/s labor to report, collaboration with Dr. Su Hilt per telephone. Lavera Guise, CNM Addendum: vag unchanged after 2 hours fhts category 1, uc q 2-3 mild, discharge home, s/s uc, srom, vag bleeding, kick counts to report reviewed, RX  ambein discussed. Rosette Reveal, CNM 4/20/1202000

## 2011-11-08 NOTE — MAU Note (Signed)
Pt states, " Dr Estanislado Pandy stripped my membranes yesterday; I was 1/50/-1. I noticed that my baby wasn't moving as much this morning and I've had contractions ever since I was stripped and these are strrong."

## 2011-11-09 ENCOUNTER — Encounter (HOSPITAL_COMMUNITY): Payer: Self-pay | Admitting: *Deleted

## 2011-11-09 ENCOUNTER — Encounter (HOSPITAL_COMMUNITY): Payer: Self-pay | Admitting: Anesthesiology

## 2011-11-09 ENCOUNTER — Inpatient Hospital Stay (HOSPITAL_COMMUNITY): Payer: Medicaid Other | Admitting: Anesthesiology

## 2011-11-09 ENCOUNTER — Inpatient Hospital Stay (HOSPITAL_COMMUNITY)
Admission: AD | Admit: 2011-11-09 | Discharge: 2011-11-12 | DRG: 766 | Disposition: A | Discharge status: Home / Self Care | Payer: Medicaid Other | Source: Ambulatory Visit | Attending: Obstetrics and Gynecology | Admitting: Obstetrics and Gynecology

## 2011-11-09 ENCOUNTER — Encounter (HOSPITAL_COMMUNITY)
Admission: AD | Disposition: A | Discharge status: Home / Self Care | Payer: Self-pay | Source: Ambulatory Visit | Attending: Obstetrics and Gynecology

## 2011-11-09 ENCOUNTER — Telehealth: Payer: Self-pay | Admitting: Advanced Practice Midwife

## 2011-11-09 DIAGNOSIS — O34219 Maternal care for unspecified type scar from previous cesarean delivery: Secondary | ICD-10-CM

## 2011-11-09 DIAGNOSIS — IMO0001 Reserved for inherently not codable concepts without codable children: Secondary | ICD-10-CM

## 2011-11-09 DIAGNOSIS — O4690 Antepartum hemorrhage, unspecified, unspecified trimester: Secondary | ICD-10-CM

## 2011-11-09 DIAGNOSIS — O324XX0 Maternal care for high head at term, not applicable or unspecified: Secondary | ICD-10-CM

## 2011-11-09 DIAGNOSIS — D649 Anemia, unspecified: Secondary | ICD-10-CM | POA: Diagnosis not present

## 2011-11-09 DIAGNOSIS — O9903 Anemia complicating the puerperium: Secondary | ICD-10-CM | POA: Diagnosis not present

## 2011-11-09 DIAGNOSIS — Z98891 History of uterine scar from previous surgery: Secondary | ICD-10-CM

## 2011-11-09 LAB — AMNISURE RUPTURE OF MEMBRANE (ROM) NOT AT ARMC: Amnisure ROM: POSITIVE

## 2011-11-09 LAB — CBC
HCT: 34.9 % — ABNORMAL LOW (ref 36.0–46.0)
Hemoglobin: 11.1 g/dL — ABNORMAL LOW (ref 12.0–15.0)
Hemoglobin: 11 g/dL — ABNORMAL LOW (ref 12.0–15.0)
MCH: 23.1 pg — ABNORMAL LOW (ref 26.0–34.0)
MCV: 73.3 fL — ABNORMAL LOW (ref 78.0–100.0)
Platelets: 139 10*3/uL — ABNORMAL LOW (ref 150–400)
RBC: 4.8 MIL/uL (ref 3.87–5.11)
RDW: 14.8 % (ref 11.5–15.5)
WBC: 12.8 10*3/uL — ABNORMAL HIGH (ref 4.0–10.5)
WBC: 19.9 10*3/uL — ABNORMAL HIGH (ref 4.0–10.5)

## 2011-11-09 SURGERY — Surgical Case
Anesthesia: Epidural | Site: Abdomen | Wound class: Clean Contaminated

## 2011-11-09 MED ORDER — FLEET ENEMA 7-19 GM/118ML RE ENEM: 1.0000 | ENEMA | RECTAL | Status: DC | PRN

## 2011-11-09 MED ORDER — KETOROLAC TROMETHAMINE 30 MG/ML IJ SOLN: 30.0000 mg | Freq: Four times a day (QID) | INTRAMUSCULAR | Status: AC | PRN

## 2011-11-09 MED ORDER — DIPHENHYDRAMINE HCL 25 MG PO CAPS: 25.0000 mg | ORAL_CAPSULE | Freq: Four times a day (QID) | ORAL | Status: DC | PRN

## 2011-11-09 MED ORDER — OXYTOCIN 10 UNIT/ML IJ SOLN
40.0000 [IU] | INTRAVENOUS | Status: DC | PRN
  Administered 2011-11-09: 40 [IU] via INTRAVENOUS

## 2011-11-09 MED ORDER — ONDANSETRON HCL 4 MG/2ML IJ SOLN: 4.0000 mg | Freq: Three times a day (TID) | INTRAMUSCULAR | Status: DC | PRN

## 2011-11-09 MED ORDER — DIPHENHYDRAMINE HCL 50 MG/ML IJ SOLN: 12.5000 mg | INTRAMUSCULAR | Status: DC | PRN

## 2011-11-09 MED ORDER — EPHEDRINE 5 MG/ML INJ: 10.0000 mg | INTRAVENOUS | Status: DC | PRN

## 2011-11-09 MED ORDER — 0.9 % SODIUM CHLORIDE (POUR BTL) OPTIME
TOPICAL | Status: DC | PRN
  Administered 2011-11-09: 200 mL
  Administered 2011-11-09: 300 mL

## 2011-11-09 MED ORDER — LACTATED RINGERS IV SOLN
500.0000 mL | INTRAVENOUS | Status: DC | PRN
  Administered 2011-11-09: 500 mL via INTRAVENOUS
  Administered 2011-11-09: 1000 mL via INTRAVENOUS

## 2011-11-09 MED ORDER — SCOPOLAMINE 1 MG/3DAYS TD PT72
MEDICATED_PATCH | TRANSDERMAL | Status: AC
  Filled 2011-11-09: qty 1

## 2011-11-09 MED ORDER — PRENATAL MULTIVITAMIN CH
1.0000 | ORAL_TABLET | Freq: Every day | ORAL | Status: DC
  Administered 2011-11-10 – 2011-11-12 (×3): 1 via ORAL
  Filled 2011-11-09 (×3): qty 1

## 2011-11-09 MED ORDER — KETOROLAC TROMETHAMINE 30 MG/ML IJ SOLN
30.0000 mg | Freq: Four times a day (QID) | INTRAMUSCULAR | Status: AC | PRN
  Administered 2011-11-09: 30 mg via INTRAVENOUS

## 2011-11-09 MED ORDER — WITCH HAZEL-GLYCERIN EX PADS: 1.0000 "application " | MEDICATED_PAD | CUTANEOUS | Status: DC | PRN

## 2011-11-09 MED ORDER — OXYCODONE-ACETAMINOPHEN 5-325 MG PO TABS
1.0000 | ORAL_TABLET | ORAL | Status: DC | PRN
  Administered 2011-11-10: 1 via ORAL
  Administered 2011-11-11: 2 via ORAL
  Filled 2011-11-09: qty 2
  Filled 2011-11-09 (×2): qty 1

## 2011-11-09 MED ORDER — IBUPROFEN 600 MG PO TABS: 600.0000 mg | ORAL_TABLET | Freq: Four times a day (QID) | ORAL | Status: DC | PRN

## 2011-11-09 MED ORDER — TERBUTALINE SULFATE 1 MG/ML IJ SOLN: 0.2500 mg | Freq: Once | INTRAMUSCULAR | Status: DC

## 2011-11-09 MED ORDER — EPHEDRINE 5 MG/ML INJ
10.0000 mg | INTRAVENOUS | Status: DC | PRN
  Filled 2011-11-09: qty 4

## 2011-11-09 MED ORDER — SIMETHICONE 80 MG PO CHEW
80.0000 mg | CHEWABLE_TABLET | Freq: Three times a day (TID) | ORAL | Status: DC
  Administered 2011-11-10 – 2011-11-12 (×9): 80 mg via ORAL

## 2011-11-09 MED ORDER — FENTANYL 2.5 MCG/ML BUPIVACAINE 1/10 % EPIDURAL INFUSION (WH - ANES)
14.0000 mL/h | INTRAMUSCULAR | Status: DC
  Administered 2011-11-09 (×2): 14 mL/h via EPIDURAL
  Filled 2011-11-09 (×3): qty 60

## 2011-11-09 MED ORDER — LACTATED RINGERS IV SOLN
INTRAVENOUS | Status: DC | PRN
  Administered 2011-11-09 (×2): via INTRAVENOUS

## 2011-11-09 MED ORDER — LIDOCAINE HCL (PF) 1 % IJ SOLN
30.0000 mL | INTRAMUSCULAR | Status: DC | PRN
  Filled 2011-11-09: qty 30

## 2011-11-09 MED ORDER — ONDANSETRON HCL 4 MG/2ML IJ SOLN
4.0000 mg | INTRAMUSCULAR | Status: DC | PRN
  Administered 2011-11-10: 4 mg via INTRAVENOUS
  Filled 2011-11-09: qty 2

## 2011-11-09 MED ORDER — ONDANSETRON HCL 4 MG PO TABS: 4.0000 mg | ORAL_TABLET | ORAL | Status: DC | PRN

## 2011-11-09 MED ORDER — MENTHOL 3 MG MT LOZG: 1.0000 | LOZENGE | OROMUCOSAL | Status: DC | PRN

## 2011-11-09 MED ORDER — SIMETHICONE 80 MG PO CHEW: 80.0000 mg | CHEWABLE_TABLET | ORAL | Status: DC | PRN

## 2011-11-09 MED ORDER — MEPERIDINE HCL 25 MG/ML IJ SOLN
INTRAMUSCULAR | Status: DC | PRN
  Administered 2011-11-09: 25 mg via INTRAVENOUS

## 2011-11-09 MED ORDER — LIDOCAINE HCL (PF) 1 % IJ SOLN
INTRAMUSCULAR | Status: DC | PRN
  Administered 2011-11-09: 3 mL
  Administered 2011-11-09: 4 mL

## 2011-11-09 MED ORDER — LACTATED RINGERS IV SOLN: INTRAVENOUS | Status: DC

## 2011-11-09 MED ORDER — BUTORPHANOL TARTRATE 2 MG/ML IJ SOLN: 1.0000 mg | INTRAMUSCULAR | Status: DC | PRN

## 2011-11-09 MED ORDER — NALBUPHINE SYRINGE 5 MG/0.5 ML
5.0000 mg | INJECTION | INTRAMUSCULAR | Status: DC | PRN
  Filled 2011-11-09: qty 1

## 2011-11-09 MED ORDER — IBUPROFEN 600 MG PO TABS
600.0000 mg | ORAL_TABLET | Freq: Four times a day (QID) | ORAL | Status: DC
  Administered 2011-11-10 – 2011-11-12 (×10): 600 mg via ORAL
  Filled 2011-11-09 (×10): qty 1

## 2011-11-09 MED ORDER — LANOLIN HYDROUS EX OINT: 1.0000 "application " | TOPICAL_OINTMENT | CUTANEOUS | Status: DC | PRN

## 2011-11-09 MED ORDER — SENNOSIDES-DOCUSATE SODIUM 8.6-50 MG PO TABS
2.0000 | ORAL_TABLET | Freq: Every day | ORAL | Status: DC
  Administered 2011-11-10 – 2011-11-11 (×2): 2 via ORAL

## 2011-11-09 MED ORDER — MEPERIDINE HCL 25 MG/ML IJ SOLN: 6.2500 mg | INTRAMUSCULAR | Status: DC | PRN

## 2011-11-09 MED ORDER — PHENYLEPHRINE 40 MCG/ML (10ML) SYRINGE FOR IV PUSH (FOR BLOOD PRESSURE SUPPORT): 80.0000 ug | PREFILLED_SYRINGE | INTRAVENOUS | Status: DC | PRN

## 2011-11-09 MED ORDER — ONDANSETRON HCL 4 MG/2ML IJ SOLN
INTRAMUSCULAR | Status: AC
  Filled 2011-11-09: qty 2

## 2011-11-09 MED ORDER — ONDANSETRON HCL 4 MG/2ML IJ SOLN: 4.0000 mg | Freq: Four times a day (QID) | INTRAMUSCULAR | Status: DC | PRN

## 2011-11-09 MED ORDER — TERBUTALINE SULFATE 1 MG/ML IJ SOLN
INTRAMUSCULAR | Status: AC
  Filled 2011-11-09: qty 1

## 2011-11-09 MED ORDER — OXYTOCIN BOLUS FROM INFUSION
500.0000 mL | Freq: Once | INTRAVENOUS | Status: DC
  Filled 2011-11-09: qty 500
  Filled 2011-11-09: qty 1000

## 2011-11-09 MED ORDER — FENTANYL CITRATE 0.05 MG/ML IJ SOLN: 25.0000 ug | INTRAMUSCULAR | Status: DC | PRN

## 2011-11-09 MED ORDER — PHENYLEPHRINE 40 MCG/ML (10ML) SYRINGE FOR IV PUSH (FOR BLOOD PRESSURE SUPPORT)
80.0000 ug | PREFILLED_SYRINGE | INTRAVENOUS | Status: DC | PRN
  Filled 2011-11-09: qty 5

## 2011-11-09 MED ORDER — ZOLPIDEM TARTRATE 5 MG PO TABS: 5.0000 mg | ORAL_TABLET | Freq: Every evening | ORAL | Status: DC | PRN

## 2011-11-09 MED ORDER — CITRIC ACID-SODIUM CITRATE 334-500 MG/5ML PO SOLN
30.0000 mL | ORAL | Status: DC | PRN
  Administered 2011-11-09: 30 mL via ORAL
  Filled 2011-11-09: qty 15

## 2011-11-09 MED ORDER — SODIUM CHLORIDE 0.9 % IV SOLN
1.0000 ug/kg/h | INTRAVENOUS | Status: DC | PRN
  Filled 2011-11-09: qty 2.5

## 2011-11-09 MED ORDER — METOCLOPRAMIDE HCL 5 MG/ML IJ SOLN: 10.0000 mg | Freq: Three times a day (TID) | INTRAMUSCULAR | Status: DC | PRN

## 2011-11-09 MED ORDER — NALOXONE HCL 0.4 MG/ML IJ SOLN: 0.4000 mg | INTRAMUSCULAR | Status: DC | PRN

## 2011-11-09 MED ORDER — OXYTOCIN 20 UNITS IN LACTATED RINGERS INFUSION - SIMPLE
125.0000 mL/h | INTRAVENOUS | Status: AC
  Administered 2011-11-09: 125 mL/h via INTRAVENOUS

## 2011-11-09 MED ORDER — OXYTOCIN 20 UNITS IN LACTATED RINGERS INFUSION - SIMPLE
INTRAVENOUS | Status: AC
  Filled 2011-11-09: qty 1000

## 2011-11-09 MED ORDER — PHENYLEPHRINE 40 MCG/ML (10ML) SYRINGE FOR IV PUSH (FOR BLOOD PRESSURE SUPPORT)
PREFILLED_SYRINGE | INTRAVENOUS | Status: AC
  Filled 2011-11-09: qty 10

## 2011-11-09 MED ORDER — FENTANYL 2.5 MCG/ML BUPIVACAINE 1/10 % EPIDURAL INFUSION (WH - ANES)
INTRAMUSCULAR | Status: DC | PRN
  Administered 2011-11-09: 13 mL/h via EPIDURAL

## 2011-11-09 MED ORDER — SCOPOLAMINE 1 MG/3DAYS TD PT72
1.0000 | MEDICATED_PATCH | Freq: Once | TRANSDERMAL | Status: DC
  Administered 2011-11-09: 1.5 mg via TRANSDERMAL

## 2011-11-09 MED ORDER — MORPHINE SULFATE (PF) 0.5 MG/ML IJ SOLN
INTRAMUSCULAR | Status: DC | PRN
  Administered 2011-11-09: 1 mg via INTRAVENOUS

## 2011-11-09 MED ORDER — OXYTOCIN 10 UNIT/ML IJ SOLN
INTRAMUSCULAR | Status: AC
  Filled 2011-11-09: qty 4

## 2011-11-09 MED ORDER — TETANUS-DIPHTH-ACELL PERTUSSIS 5-2.5-18.5 LF-MCG/0.5 IM SUSP
0.5000 mL | Freq: Once | INTRAMUSCULAR | Status: AC
  Administered 2011-11-11: 0.5 mL via INTRAMUSCULAR
  Filled 2011-11-09: qty 0.5

## 2011-11-09 MED ORDER — MORPHINE SULFATE (PF) 0.5 MG/ML IJ SOLN
INTRAMUSCULAR | Status: DC | PRN
  Administered 2011-11-09: 4 mg via EPIDURAL

## 2011-11-09 MED ORDER — LACTATED RINGERS IV SOLN
INTRAVENOUS | Status: DC
  Administered 2011-11-09 (×2): via INTRAVENOUS

## 2011-11-09 MED ORDER — FENTANYL CITRATE 0.05 MG/ML IJ SOLN
INTRAMUSCULAR | Status: DC | PRN
  Administered 2011-11-09: 100 ug via INTRAVENOUS

## 2011-11-09 MED ORDER — OXYCODONE-ACETAMINOPHEN 5-325 MG PO TABS
1.0000 | ORAL_TABLET | ORAL | Status: DC | PRN
Start: 2011-11-09 — End: 2011-11-09

## 2011-11-09 MED ORDER — FENTANYL 2.5 MCG/ML BUPIVACAINE 1/10 % EPIDURAL INFUSION (WH - ANES): 14.0000 mL/h | INTRAMUSCULAR | Status: DC

## 2011-11-09 MED ORDER — SODIUM CHLORIDE 0.9 % IJ SOLN: 3.0000 mL | INTRAMUSCULAR | Status: DC | PRN

## 2011-11-09 MED ORDER — HYDROXYZINE HCL 50 MG PO TABS: 50.0000 mg | ORAL_TABLET | Freq: Four times a day (QID) | ORAL | Status: DC | PRN

## 2011-11-09 MED ORDER — OXYTOCIN 20 UNITS IN LACTATED RINGERS INFUSION - SIMPLE: 125.0000 mL/h | Freq: Once | INTRAVENOUS | Status: DC

## 2011-11-09 MED ORDER — ACETAMINOPHEN 325 MG PO TABS: 650.0000 mg | ORAL_TABLET | ORAL | Status: DC | PRN

## 2011-11-09 MED ORDER — DIBUCAINE 1 % RE OINT: 1.0000 "application " | TOPICAL_OINTMENT | RECTAL | Status: DC | PRN

## 2011-11-09 MED ORDER — LACTATED RINGERS IV SOLN: 500.0000 mL | Freq: Once | INTRAVENOUS | Status: DC

## 2011-11-09 MED ORDER — DIPHENHYDRAMINE HCL 50 MG/ML IJ SOLN: 25.0000 mg | INTRAMUSCULAR | Status: DC | PRN

## 2011-11-09 MED ORDER — KETOROLAC TROMETHAMINE 30 MG/ML IJ SOLN
INTRAMUSCULAR | Status: AC
  Administered 2011-11-09: 30 mg via INTRAVENOUS
  Filled 2011-11-09: qty 1

## 2011-11-09 MED ORDER — CEFAZOLIN SODIUM-DEXTROSE 2-3 GM-% IV SOLR
2.0000 g | INTRAVENOUS | Status: AC
  Administered 2011-11-09: 2 g via INTRAVENOUS
  Filled 2011-11-09: qty 50

## 2011-11-09 MED ORDER — MORPHINE SULFATE 0.5 MG/ML IJ SOLN
INTRAMUSCULAR | Status: AC
  Filled 2011-11-09: qty 20

## 2011-11-09 MED ORDER — DIPHENHYDRAMINE HCL 25 MG PO CAPS
25.0000 mg | ORAL_CAPSULE | ORAL | Status: DC | PRN
  Administered 2011-11-10: 25 mg via ORAL
  Filled 2011-11-09: qty 1

## 2011-11-09 MED ORDER — FENTANYL CITRATE 0.05 MG/ML IJ SOLN
INTRAMUSCULAR | Status: AC
  Filled 2011-11-09: qty 4

## 2011-11-09 MED ORDER — MEPERIDINE HCL 25 MG/ML IJ SOLN
INTRAMUSCULAR | Status: AC
  Filled 2011-11-09: qty 1

## 2011-11-09 MED ORDER — HYDROXYZINE HCL 50 MG/ML IM SOLN: 50.0000 mg | Freq: Four times a day (QID) | INTRAMUSCULAR | Status: DC | PRN

## 2011-11-09 MED ORDER — LACTATED RINGERS IV SOLN
500.0000 mL | Freq: Once | INTRAVENOUS | Status: AC
  Administered 2011-11-09: 1000 mL via INTRAVENOUS

## 2011-11-09 MED ORDER — TERBUTALINE SULFATE 1 MG/ML IJ SOLN
0.2500 mg | Freq: Once | INTRAMUSCULAR | Status: AC | PRN
  Administered 2011-11-09: 0.25 mg via SUBCUTANEOUS

## 2011-11-09 MED ORDER — SODIUM BICARBONATE 8.4 % IV SOLN
INTRAVENOUS | Status: DC | PRN
  Administered 2011-11-09: 5 mL via EPIDURAL

## 2011-11-09 SURGICAL SUPPLY — 41 items
APL SKNCLS STERI-STRIP NONHPOA (GAUZE/BANDAGES/DRESSINGS) ×1
BENZOIN TINCTURE PRP APPL 2/3 (GAUZE/BANDAGES/DRESSINGS) ×2 IMPLANT
CHLORAPREP W/TINT 26ML (MISCELLANEOUS) ×2 IMPLANT
CLOTH BEACON ORANGE TIMEOUT ST (SAFETY) ×2 IMPLANT
CONTAINER PREFILL 10% NBF 15ML (MISCELLANEOUS) IMPLANT
DRAIN JACKSON PRT FLT 7MM (DRAIN) ×1 IMPLANT
DRESSING TELFA 8X3 (GAUZE/BANDAGES/DRESSINGS) ×1 IMPLANT
ELECT REM PT RETURN 9FT ADLT (ELECTROSURGICAL) ×2
ELECTRODE REM PT RTRN 9FT ADLT (ELECTROSURGICAL) ×1 IMPLANT
EVACUATOR SILICONE 100CC (DRAIN) ×1 IMPLANT
EXTRACTOR VACUUM M CUP 4 TUBE (SUCTIONS) IMPLANT
GAUZE SPONGE 4X4 12PLY STRL LF (GAUZE/BANDAGES/DRESSINGS) ×1 IMPLANT
GLOVE BIO SURGEON STRL SZ7.5 (GLOVE) ×4 IMPLANT
GLOVE BIOGEL PI IND STRL 7.5 (GLOVE) ×1 IMPLANT
GLOVE BIOGEL PI INDICATOR 7.5 (GLOVE) ×1
GOWN PREVENTION PLUS LG XLONG (DISPOSABLE) ×6 IMPLANT
HEMOSTAT SURGICEL 4X8 (HEMOSTASIS) ×1 IMPLANT
KIT ABG SYR 3ML LUER SLIP (SYRINGE) ×1 IMPLANT
NDL HYPO 25X5/8 SAFETYGLIDE (NEEDLE) IMPLANT
NEEDLE HYPO 22GX1.5 SAFETY (NEEDLE) IMPLANT
NEEDLE HYPO 25X5/8 SAFETYGLIDE (NEEDLE) ×2 IMPLANT
NS IRRIG 1000ML POUR BTL (IV SOLUTION) ×2 IMPLANT
PACK C SECTION WH (CUSTOM PROCEDURE TRAY) ×2 IMPLANT
PAD ABD 7.5X8 STRL (GAUZE/BANDAGES/DRESSINGS) ×1 IMPLANT
RETRACTOR WND ALEXIS 25 LRG (MISCELLANEOUS) ×1 IMPLANT
RTRCTR WOUND ALEXIS 25CM LRG (MISCELLANEOUS) ×2
SLEEVE SCD COMPRESS KNEE MED (MISCELLANEOUS) ×1 IMPLANT
STRIP CLOSURE SKIN 1/2X4 (GAUZE/BANDAGES/DRESSINGS) ×2 IMPLANT
SUT CHROMIC 2 0 CT 1 (SUTURE) ×2 IMPLANT
SUT MNCRL AB 3-0 PS2 27 (SUTURE) ×2 IMPLANT
SUT PLAIN 0 NONE (SUTURE) IMPLANT
SUT PLAIN 2 0 XLH (SUTURE) ×2 IMPLANT
SUT SILK 2 0 FS (SUTURE) ×1 IMPLANT
SUT VIC AB 0 CT1 36 (SUTURE) ×2 IMPLANT
SUT VIC AB 0 CTX 36 (SUTURE) ×6
SUT VIC AB 0 CTX36XBRD ANBCTRL (SUTURE) ×3 IMPLANT
SYR CONTROL 10ML LL (SYRINGE) IMPLANT
TAPE CLOTH SURG 4X10 WHT LF (GAUZE/BANDAGES/DRESSINGS) ×1 IMPLANT
TOWEL OR 17X24 6PK STRL BLUE (TOWEL DISPOSABLE) ×4 IMPLANT
TRAY FOLEY CATH 14FR (SET/KITS/TRAYS/PACK) ×1 IMPLANT
WATER STERILE IRR 1000ML POUR (IV SOLUTION) ×2 IMPLANT

## 2011-11-09 NOTE — OR Nursing (Signed)
Fundal Massage by DLWegner RN 

## 2011-11-09 NOTE — Progress Notes (Signed)
Agrees to IUPC and scalp electrode, pitocin if inadequate uc. O Fhts category 1     uc q 2-5 55-75 mmHg IUPC and scalp electrode placed easily     Vag same  A no cervical change P  Dr. Su Hilt updated on pt status at 1700 as prior note, plan IUPC and Pitocin augmentation if uc not adequate, will observe contraction pattern. Lavera Guise, CNM

## 2011-11-09 NOTE — Transfer of Care (Signed)
Immediate Anesthesia Transfer of Care Note  Patient: [name]  Procedure(s) Performed: Procedure(s) (LRB): CESAREAN SECTION (N/A)  Patient Location: PACU  Anesthesia Type: Epidural  Level of Consciousness: awake, alert  and oriented  Airway & Oxygen Therapy: Patient Spontanous Breathing  Post-op Assessment: Report given to PACU RN and Post -op Vital signs reviewed and stable  Post vital signs: stable  Complications: No apparent anesthesia complications

## 2011-11-09 NOTE — Anesthesia Procedure Notes (Signed)
Epidural Patient location during procedure: OB Start time: 11/09/2011 7:45 AM  Staffing Anesthesiologist: Clorinda Wyble A. Performed by: anesthesiologist   Preanesthetic Checklist Completed: patient identified, site marked, surgical consent, pre-op evaluation, timeout performed, IV checked, risks and benefits discussed and monitors and equipment checked  Epidural Patient position: sitting Prep: site prepped and draped and DuraPrep Patient monitoring: continuous pulse ox and blood pressure Approach: midline Injection technique: LOR air  Needle:  Needle type: Tuohy  Needle gauge: 17 G Needle length: 9 cm Needle insertion depth: 5 cm cm Catheter type: closed end flexible Catheter size: 19 Gauge Catheter at skin depth: 10 cm Test dose: negative and Other  Assessment Events: blood not aspirated, injection not painful, no injection resistance, negative IV test and no paresthesia  Additional Notes Patient identified. Risks and benefits discussed including failed block, incomplete  Pain control, post dural puncture headache, nerve damage, paralysis, blood pressure Changes, nausea, vomiting, reactions to medications-both toxic and allergic and post Partum back pain. All questions were answered. Patient expressed understanding and wished to proceed. Sterile technique was used throughout procedure. Epidural site was Dressed with sterile barrier dressing. No paresthesias, signs of intravascular injection Or signs of intrathecal spread were encountered.  Patient was more comfortable after the epidural was dosed. Please see RN's note for documentation of vital signs and FHR which are stable.

## 2011-11-09 NOTE — Progress Notes (Signed)
Comfortable with epidural O VSS      Fhts category 1      uc q 3 mod prolonged      abd soft between uc      Vag 9 100 0/+1 no capit A VBAC P repositions Sims or squat position discussed Lavera Guise, CNM

## 2011-11-09 NOTE — MAU Note (Signed)
Patient is here for labor eval. She states the ctx q57m and woke her from her from sleep. Denies lof or vaginal bleeding. Reports good fetal movement
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
## 2011-11-09 NOTE — Progress Notes (Signed)
[name] is a 26 y.o. [phone] at [redacted]w[redacted]d.  Subjective: Comfortable w/ epidural  Objective: BP 119/88  Pulse 72  Temp(Src) 98.6 F (37 C) (Oral)  Resp 18  Ht 5\' 3"  (1.6 m)  Wt 84.823 kg (187 lb)  BMI 33.13 kg/m2  SpO2 100%  LMP 02/07/2011      FHT:  FHR: 115 bpm, variability: minimal ,  accelerations:  Present,  decelerations:  Absent UC:   regular, every 2-4 minutes, strong SVE:   Dilation: 8.5 Effacement (%): 100 Station: 0 Exam by:: Tavita Eastham cnm  Labs:  Assessment / Plan: Spontaneous labor, progressing normally    Labor: Progressing normally Preeclampsia:  NA Fetal Wellbeing:  Category I-II Pain Control:  Epidural I/D:  n/a Anticipated MOD:  NSVD  [name] 11/09/2011, 1:57 PM

## 2011-11-09 NOTE — Progress Notes (Signed)
1725 Called to room to evaluate, O2 10 l mask on, on L side, fhts 40 to 90s  Dr. Su Hilt called et on the way plan to cesarean section,  fhts with recovery to 120s terbutaline given. Risks of cesarean section discussed bleeding, infection, injury to organs.  Lavera Guise, CNM

## 2011-11-09 NOTE — Anesthesia Preprocedure Evaluation (Addendum)
Anesthesia Evaluation  Patient identified by MRN, date of birth, ID band Patient awake    Reviewed: Allergy & Precautions, H&P , Patient's Chart, lab work & pertinent test results  Airway Mallampati: III TM Distance: >3 FB Neck ROM: full    Dental No notable dental hx. (+) Teeth Intact   Pulmonary neg pulmonary ROS, asthma ,  breath sounds clear to auscultation  Pulmonary exam normal       Cardiovascular hypertension, Rhythm:regular Rate:Normal     Neuro/Psych negative neurological ROS  negative psych ROS   GI/Hepatic negative GI ROS, Neg liver ROS,   Endo/Other  negative endocrine ROS  Renal/GU negative Renal ROS  negative genitourinary   Musculoskeletal   Abdominal Normal abdominal exam  (+)   Peds  Hematology negative hematology ROS (+) Sickle Cell Trait   Anesthesia Other Findings   Reproductive/Obstetrics (+) Pregnancy                           Anesthesia Physical Anesthesia Plan  ASA: II and Emergent  Anesthesia Plan: Epidural   Post-op Pain Management:    Induction:   Airway Management Planned:   Additional Equipment:   Intra-op Plan:   Post-operative Plan:   Informed Consent: I have reviewed the patients History and Physical, chart, labs and discussed the procedure including the risks, benefits and alternatives for the proposed anesthesia with the patient or authorized representative who has indicated his/her understanding and acceptance.     Plan Discussed with: Anesthesiologist and Surgeon  Anesthesia Plan Comments:        Anesthesia Quick Evaluation

## 2011-11-09 NOTE — Anesthesia Postprocedure Evaluation (Signed)
Anesthesia Post Note  Patient: [name]  Procedure(s) Performed: Procedure(s) (LRB): CESAREAN SECTION (N/A)  Anesthesia type: Epidural  Patient location: PACU  Post pain: Pain level controlled  Post assessment: Post-op Vital signs reviewed  Last Vitals:  Filed Vitals:   11/09/11 2130  BP: 117/75  Pulse: 71  Temp:   Resp: 17    Post vital signs: stable  Level of consciousness: awake  Complications: No apparent anesthesia complications

## 2011-11-09 NOTE — Progress Notes (Signed)
[name] is a 26 y.o. [phone] at [redacted]w[redacted]d.  Subjective: Comfortable w/ epidural  Objective: BP 125/84  Pulse 59  Temp(Src) 98.7 F (37.1 C) (Oral)  Resp 18  Ht 5\' 3"  (1.6 m)  Wt 84.823 kg (187 lb)  BMI 33.13 kg/m2  SpO2 100%  LMP 02/07/2011      FHT:  FHR: 115 bpm, variability: minimal ,  accelerations:  Present,  decelerations:  Absent UC:   irregular, every 2-5 minutes, strong SVE:   Dilation: 7 Effacement (%): 90 Station: 0 Exam by:: [name] cnm  Labs:  Assessment / Plan: Spontaneous labor, progressing normally  Labor: Progressing normally Preeclampsia:  NA Fetal Wellbeing:  Category II Pain Control:  Epidural I/D:  n/a Anticipated MOD:  NSVD  [name] 11/09/2011, 11:46 PM

## 2011-11-09 NOTE — Progress Notes (Signed)
[name] is a 26 y.o. [phone] at [redacted]w[redacted]d.  Subjective: Getting epidural  Objective: BP 115/79  Pulse 61  Temp(Src) 98.3 F (36.8 C) (Oral)  Resp 20  Ht 5\' 3"  (1.6 m)  Wt 84.823 kg (187 lb)  BMI 33.13 kg/m2  SpO2 100%  LMP 02/07/2011      FHT:  FHR: 110 bpm, variability: moderate,  accelerations:  Present,  decelerations:  Absent UC:   irregular, every 2-7 minutes SVE:   Dilation: 2.5 Effacement (%): 80 Station: -1;-2 Exam by:: c soliz rn  Labs: Lab Results  Component Value Date   WBC 12.8* 11/09/2011   HGB 11.1* 11/09/2011   HCT 35.2* 11/09/2011   MCV 73.3* 11/09/2011   PLT 139* 11/09/2011    Assessment / Plan: Spontaneous early labor  Labor: Progressing slowly, will check in 1 hour and consider pitocin PRN Preeclampsia:  NA Fetal Wellbeing:  Category I Pain Control:  Epidural I/D:  n/a Anticipated MOD:  NSVD  [name] 11/09/2011, 7:47 AM

## 2011-11-09 NOTE — Progress Notes (Signed)
[name] is a 26 y.o. [phone] at [redacted]w[redacted]d.  Subjective: Still mild pain w/ epidural  Objective: BP 119/83  Pulse 71  Temp(Src) 98.3 F (36.8 C) (Oral)  Resp 18  Ht 5\' 3"  (1.6 m)  Wt 84.823 kg (187 lb)  BMI 33.13 kg/m2  SpO2 100%  LMP 02/07/2011      FHT:  FHR: 110 bpm, variability: min-mod,  accelerations:  Present,  decelerations:  Absent UC:   irregular, every 2-6 minutes, strong SVE:   Dilation: 5 Effacement (%): 90 Station: -1 Exam by:: Ivonne Andrew, CNM  Labs: Lab Results  Component Value Date   WBC 12.8* 11/09/2011   HGB 11.1* 11/09/2011   HCT 35.2* 11/09/2011   MCV 73.3* 11/09/2011   PLT 139* 11/09/2011    Assessment / Plan: Spontaneous labor, progressing normally  Labor: Progressing normally Preeclampsia:  NA Fetal Wellbeing:  Category I Pain Control:  Epidural I/D:  n/a Anticipated MOD:  NSVD  Taj Arteaga 11/09/2011, 9:29 AM

## 2011-11-10 ENCOUNTER — Encounter (HOSPITAL_COMMUNITY): Payer: Self-pay | Admitting: Obstetrics and Gynecology

## 2011-11-10 LAB — CBC
MCH: 23 pg — ABNORMAL LOW (ref 26.0–34.0)
MCHC: 31 g/dL (ref 30.0–36.0)
RDW: 14.8 % (ref 11.5–15.5)

## 2011-11-10 NOTE — Progress Notes (Signed)
Clinical Social Work Department PSYCHOSOCIAL ASSESSMENT - MATERNAL/CHILD 11/10/2011  Patient:  [name],[name]  Account Number:  [id number]  Admit Date:  11/09/2011  Childs Name:   [name]    Clinical Social Worker:  Stephen Baruch, LCSWA   Date/Time:  11/10/2011 11:15 AM  Date Referred:  11/10/2011   Referral source  CN     Referred reason  LPNC   Other referral source:    I:  FAMILY / HOME ENVIRONMENT Child's legal guardian:  PARENT  Guardian - Name Guardian - Age Guardian - Address  [name] 26 [address] [place], [place] [address]  Thomas Minkler 21    Other household support members/support persons Name Relationship DOB  [name] MOTHER   [name] DAUGHTER 01/2005   Other support:    II  PSYCHOSOCIAL DATA Information Source:  Patient Interview  Financial and Community Resources Employment:   Financial resources:  Medicaid If Medicaid - County:  [place] Other  WIC  Food Stamps   School / Grade:   Maternity Care Coordinator / Child Services Coordination / Early Interventions:  Cultural issues impacting care:    III  STRENGTHS Strengths  Adequate Resources  Home prepared for Child (including basic supplies)  Understanding of illness   Strength comment:    IV  RISK FACTORS AND CURRENT PROBLEMS Current Problem:  None   Risk Factor & Current Problem Patient Issue Family Issue Risk Factor / Current Problem Comment  Other - See comment Y N LPNC@ 30 weeks    V  SOCIAL WORK ASSESSMENT Sw referral received to assess reason for LPNC @ 30 weeks. Pt's pregnancy was confirmed at 13 weeks at [facility] in [place], [place].  Pt states she did not have a "regular" doctor rather went to hospital if she thought something was wrong.  She was not able to give this Sw an explanation for failing to establish regular PNC.  Pt relocated back to the area, as she if from here, July 05, 2011.  Once pt was settled here, she started PNC.  She  denies illegal substance use and verbalized understanding of hospital drug testing policy.  UDS is positive for opiates, meconium results are pending.  Pt told Sw that she had a tooth extracted Friday of last week and was prescribed Tylenol 3 by Dr. Stokes (as explanation of positive drug screen).  She reports having all the necessary supplies for the infant and good family support. Sw will follow up with drug screen results and make a referral if needed.      VI SOCIAL WORK PLAN Social Work Plan  No Further Intervention Required / No Barriers to Discharge   Type of pt/family education:   If child protective services report - county:   If child protective services report - date:   Information/referral to community resources comment:   Other social work plan:      

## 2011-11-10 NOTE — Progress Notes (Signed)
S: comfortable, little bleeding, slept little breast     Feeding, no N,V O VSS alert, no distress, Lungs clear bilaterally, AP RRR, abd softly distended, bowel sounds hypoactive, dressing dry     ff JP sm amount red drainage     sm  flowperineum clean intact     -Homans sign bilaterally,       No edema A normal involution     Lactating     Pp day  P plan foley out, ambulation, eating regular diet, continue care Lavera Guise, CNM

## 2011-11-10 NOTE — Progress Notes (Signed)
UR chart review completed.  

## 2011-11-10 NOTE — Anesthesia Postprocedure Evaluation (Signed)
  Anesthesia Post-op Note  Patient: [name]  Procedure(s) Performed: Procedure(s) (LRB): CESAREAN SECTION (N/A)  Patient Location: Mother/Baby  Anesthesia Type: Epidural  Level of Consciousness: awake, alert  and oriented  Airway and Oxygen Therapy: Patient Spontanous Breathing  Post-op Pain: none  Post-op Assessment: Post-op Vital signs reviewed, Patient's Cardiovascular Status Stable, No headache, No backache, No residual numbness and No residual motor weakness  Post-op Vital Signs: Reviewed and stable  Complications: No apparent anesthesia complications

## 2011-11-10 NOTE — Addendum Note (Signed)
Addendum  created 11/10/11 0803 by Shanon Payor, CRNA   Modules edited:Notes Section

## 2011-11-11 NOTE — Progress Notes (Signed)
Subjective: Postpartum Day 2 Cesarean Delivery Patient reports tolerating PO, + flatus and no problems voiding.    Objective: Vital signs in last 24 hours: Temp:  [97.9 F (36.6 C)-98.8 F (37.1 C)] 98 F (36.7 C) (04/23 0506) Pulse Rate:  [83-96] 88  (04/23 0506) Resp:  [18-20] 18  (04/23 0506) BP: (101-109)/(70-73) 105/71 mmHg (04/23 0506) SpO2:  [95 %-99 %] 99 % (04/22 1645)  Physical Exam:  General: alert, cooperative and no distress Lochia: appropriate Uterine Fundus: firm Incision: healing well, no significant drainage, no significant erythema, jp with sm amount serous fluid DVT Evaluation: Negative Homan's sign. No significant calf/ankle edema.   Basename 11/10/11 0525 11/09/11 2035  HGB 9.0* 11.0*  HCT 29.0* 34.9*    Assessment/Plan: anemia Status post Cesarean section. Doing well postoperatively.  Continue current care.  Alaylah Heatherington, [facility] 11/11/2011, 8:43 AM

## 2011-11-12 DIAGNOSIS — Z98891 History of uterine scar from previous surgery: Secondary | ICD-10-CM

## 2011-11-12 NOTE — Discharge Summary (Signed)
Obstetric Discharge Summary Reason for Admission: onset of labor and rupture of membranes Prenatal Procedures: ultrasound Intrapartum Procedures: cesarean: low cervical, transverse Postpartum Procedures: none Complications-Operative and Postpartum: none Hemoglobin  Date Value Range Status  11/10/2011 9.0* 12.0-15.0 (g/dL) Final     DELTA CHECK NOTED     REPEATED TO VERIFY     HCT  Date Value Range Status  11/10/2011 29.0* 36.0-46.0 (%) Final    Physical Exam:  General: alert, cooperative and no distress Lochia: appropriate Uterine Fundus: firm Incision: healing well, no dehiscence, no significant erythema, Serosanguinous fluid draining around tubing of JP drain. 2/3 of steri-strips saturated. JP drain w/ ~10 ml dark serosanguinous fluid. Wet stri-strips removed, replaced. JP drain removed w/out difficulty. Small amount of drainage from site. DVT Evaluation: Negative Homan's sign. No significant calf/ankle edema.  Discharge Diagnoses: Term Pregnancy-delivered and failed TOLAC due to NRFHTs, FTP. Anemia  Discharge Information: Date: 11/12/2011 Activity: pelvic rest Diet: routine and increase dietary iron Medications: Ibuprofen, Colace, Iron and Percocet Condition: stable Instructions: refer to practice specific booklet and Nexplanon Discharge to: home Follow-up Information    Follow up with [facility] in 6 weeks. (or MAU as needed)    Contact information:   [address] [place] [address]          Newborn Data: Live born female  Birth Weight: 6 lb 9.6 oz (2995 g) APGAR: 9, 9  Home with mother. Breastfeeding Contraception: Sharin Mons, Beyonca Wisz 11/12/2011, 10:19 AM

## 2011-11-13 NOTE — Op Note (Signed)
Indications: failure to progress: arrest of dilation and non-reassuring fetal status   Pre-operative Diagnosis: 39 week 2 day pregnancy.   Post-operative Diagnosis: same   Surgeon: Purcell Nails   Assistants: Lavera Guise, CNM   Anesthesia: Epidural anesthesia   Procedure Details  The patient was seen in the Holding Room. The risks, benefits, complications, treatment options, and expected outcomes were discussed with the patient. The patient concurred with the proposed plan, giving informed consent. The site of surgery properly noted/marked. The patient was taken to Operating Room # 1, identified as [name] and the procedure verified as C-Section Delivery. A Time Out was held and the above information confirmed.  After induction of anesthesia, the patient was draped and prepped in the usual sterile manner. A Pfannenstiel incision was made and carried down through the subcutaneous tissue to the fascia. Fascial incision was made and extended transversely. The fascia was separated from the underlying rectus tissue superiorly and inferiorly. The peritoneum was identified and entered. Peritoneal incision was extended longitudinally. The utero-vesical peritoneal reflection was incised transversely and the bladder flap was bluntly freed from the lower uterine segment. A low transverse uterine incision was made. Infant was delivered from cephalic, LOP presentation with Apgar scores of 9 at one minute and 9 at five minutes. After the umbilical cord was clamped and cut cord blood was obtained for evaluation. The placenta was removed intact and appeared normal. The uterine outline, tubes and ovaries appeared normal. The uterine incision was closed with running locked sutures of 0 Vicryl. A second imbricating layer was performed. Adhesions of the right fallopian tube to the uterine wall were excised. Hemostasis was observed. Lavage was carried out until clear. The fascia was then reapproximated with  running sutures of 0- Vicryl. J-P drain was placed and sutured down with 2-0 silk. The skin was reapproximated with 3.0 Monocryl via subcuticular stitch.  Instrument, sponge, and needle counts were correct prior the abdominal closure and at the conclusion of the case.   Findings:  Normal appearing bilateral ovaries and tubes   Estimated Blood Loss: 700 cc   Drains: foley to gravity 100 cc   Total IV Fluids: 2200 ml   Specimens: Placenta   Complications: None; patient tolerated the procedure well.   Disposition: PACU - hemodynamically stable.   Condition: stable   Attending Attestation: I was present and scrubbed for the entire procedure.

## 2011-11-14 ENCOUNTER — Encounter: Payer: Medicaid Other | Admitting: Obstetrics and Gynecology

## 2012-08-25 ENCOUNTER — Encounter (HOSPITAL_COMMUNITY): Payer: Self-pay | Admitting: *Deleted

## 2012-08-25 ENCOUNTER — Inpatient Hospital Stay (HOSPITAL_COMMUNITY)
Admission: AD | Admit: 2012-08-25 | Discharge: 2012-08-25 | Disposition: A | Discharge status: Home / Self Care | Payer: Self-pay | Source: Ambulatory Visit | Attending: Obstetrics and Gynecology | Admitting: Obstetrics and Gynecology

## 2012-08-25 DIAGNOSIS — O99891 Other specified diseases and conditions complicating pregnancy: Secondary | ICD-10-CM | POA: Insufficient documentation

## 2012-08-25 DIAGNOSIS — R51 Headache: Secondary | ICD-10-CM | POA: Insufficient documentation

## 2012-08-25 DIAGNOSIS — R109 Unspecified abdominal pain: Secondary | ICD-10-CM | POA: Insufficient documentation

## 2012-08-25 LAB — URINALYSIS, ROUTINE W REFLEX MICROSCOPIC
Glucose, UA: NEGATIVE mg/dL
Ketones, ur: NEGATIVE mg/dL
Protein, ur: NEGATIVE mg/dL
Urobilinogen, UA: 0.2 mg/dL (ref 0.0–1.0)

## 2012-08-25 LAB — COMPREHENSIVE METABOLIC PANEL
BUN: 5 mg/dL — ABNORMAL LOW (ref 6–23)
CO2: 21 mEq/L (ref 19–32)
Calcium: 8.9 mg/dL (ref 8.4–10.5)
Chloride: 100 mEq/L (ref 96–112)
Creatinine, Ser: 0.6 mg/dL (ref 0.50–1.10)
GFR calc non Af Amer: >90 mL/min (ref >90)
Total Bilirubin: 0.1 mg/dL — ABNORMAL LOW (ref 0.3–1.2)

## 2012-08-25 LAB — CBC
Hemoglobin: 10.1 g/dL — ABNORMAL LOW (ref 12.0–15.0)
Platelets: 141 10*3/uL — ABNORMAL LOW (ref 150–400)
RBC: 4.61 MIL/uL (ref 3.87–5.11)
WBC: 15.2 10*3/uL — ABNORMAL HIGH (ref 4.0–10.5)

## 2012-08-25 LAB — WET PREP, GENITAL
Clue Cells Wet Prep HPF POC: NONE SEEN
Trich, Wet Prep: NONE SEEN

## 2012-08-25 LAB — URINE MICROSCOPIC-ADD ON

## 2012-08-25 NOTE — MAU Note (Signed)
States she gets [facility] in [place]. States she is in [place] due to some "family matters" and plans to return to [place]. States she has been feeling pressure in lower abdomen for about a week. States she has been having HA's. States she had preeclampsia with last pregnancy. Was told with this pregnancy that she has had protein in her urine and were watching it. Felt that she needed to come in to make sure everything was OK. Last prenatal appointment was 2 weeks ago. Denies any other complications with this pregnancy.

## 2012-08-25 NOTE — ED Notes (Signed)
Chief Complaint:  Abdominal Pain and Headache   None     HPI: [name] is a 27 y.o. [id number] at [redacted]w[redacted]d who presents to maternity admissions reporting headache, epigastric pain and lower abdominal pressure.  Has a history of pre-eclampsia with her first pregnancy and had similar symptoms than those she is experiencing today. She reports seeing black spots x 1 week and headache x 3days, frontal. Also reports epigastric pain for 1 week. No nausea, no vomiting.   Denies contractions, leakage of fluid or vaginal bleeding. Good fetal movement.   Pregnancy Course:  Gets her prenatal care in [place]. HAs been in [place] for 3 weeks. Reports that she was found to have protein in her urine at 18wga but no elevated BP.  No complications during this pregnancy Past Medical History: Past Medical History  Diagnosis Date  . Pregnancy induced hypertension   . Asthma     last attack >15 yrs ago  . Sickle cell trait   . Preterm labor     Past obstetric history:     OB History    Grav Para Term Preterm Abortions TAB SAB Ect Mult Living   5 2 1 1 2 1 1  0 0 2     # Outc Date GA Lbr Len/2nd Wgt Sex Del Anes PTL Lv   1 PRE 2006 [redacted]w[redacted]d    LTCS   Yes   Comments: pre ecclampsia   2 TAB 2007           3 SAB 2009           4 [facility] 4/13 [redacted]w[redacted]d 18:16 / 00:57  F LTCS EPI  Yes   Comments: wnl   5 CUR               Past Surgical History: Past Surgical History  Procedure Date  . Ovarian cyst removal 2006  . Cesarean section   . Cesarean section 11/09/2011    Procedure: CESAREAN SECTION;  Surgeon: Purcell Nails, MD;  Location: [facility];  Service: Gynecology;  Laterality: N/A;  Repeat cesarean section of baby girl at 35 APGAR 9/9    Family History: Family History  Problem Relation Age of Onset  . Asthma Mother   . Mental illness Mother     bipolar  . Drug abuse Mother   . Hypertension Maternal Grandmother   . Stroke Maternal Grandmother   . Asthma Maternal Grandmother   . Diabetes Maternal  Grandmother   . Cancer Maternal Grandmother     lung  . Anesthesia problems Neg Hx     Social History: History  Substance Use Topics  . Smoking status: Never Smoker   . Smokeless tobacco: Never Used  . Alcohol Use: No    Allergies: No Known Allergies  Meds:  No prescriptions prior to admission    ROS: Pertinent findings in history of present illness.  Physical Exam  Blood pressure 114/73, pulse 99, temperature 98 F (36.7 C), temperature source Oral, resp. rate 18, last menstrual period 02/07/2011, unknown if currently breastfeeding. GENERAL: Well-developed, well-nourished female in no acute distress.  HEENT: normocephalic HEART: normal rate RESP: normal effort ABDOMEN: Soft, non-tender, gravid appropriate for gestational age EXTREMITIES: Nontender, no edema NEURO: alert and oriented, CN2-12 grossly intact SPECULUM EXAM: NEFG, white discharge, no bleeding  Dilation: Closed Effacement (%): Thick Cervical Position: Posterior Station: Ballotable Presentation: Undeterminable Exam by:: Sarajane Marek, RNC  FHT:  Baseline 130 , moderate variability, accelerations present, no decelerations Contractions: q 4  mins, irreg   Labs: Results for orders placed during the hospital encounter of 08/25/12 (from the past 24 hour(s))  URINALYSIS, ROUTINE W REFLEX MICROSCOPIC     Status: Abnormal   Collection Time   08/25/12 12:51 PM      Component Value Range   Color, Urine YELLOW  YELLOW   APPearance CLEAR  CLEAR   Specific Gravity, Urine 1.015  1.005 - 1.030   pH 7.0  5.0 - 8.0   Glucose, UA NEGATIVE  NEGATIVE mg/dL   Hgb urine dipstick NEGATIVE  NEGATIVE   Bilirubin Urine NEGATIVE  NEGATIVE   Ketones, ur NEGATIVE  NEGATIVE mg/dL   Protein, ur NEGATIVE  NEGATIVE mg/dL   Urobilinogen, UA 0.2  0.0 - 1.0 mg/dL   Nitrite NEGATIVE  NEGATIVE   Leukocytes, UA SMALL (*) NEGATIVE  URINE MICROSCOPIC-ADD ON     Status: Normal   Collection Time   08/25/12 12:51 PM      Component Value Range    Squamous Epithelial / LPF RARE  RARE   WBC, UA 0-2  <3 WBC/hpf   RBC / HPF 0-2  <3 RBC/hpf   Bacteria, UA RARE  RARE   Urine-Other RARE YEAST    WET PREP, GENITAL     Status: Abnormal   Collection Time   08/25/12  1:37 PM      Component Value Range   Yeast Wet Prep HPF POC NONE SEEN  NONE SEEN   Trich, Wet Prep NONE SEEN  NONE SEEN   Clue Cells Wet Prep HPF POC NONE SEEN  NONE SEEN   WBC, Wet Prep HPF POC MANY (*) NONE SEEN  FETAL FIBRONECTIN     Status: Normal   Collection Time   08/25/12  1:37 PM      Component Value Range   Fetal Fibronectin NEGATIVE  NEGATIVE  COMPREHENSIVE METABOLIC PANEL     Status: Abnormal   Collection Time   08/25/12  2:07 PM      Component Value Range   Sodium 132 (*) 135 - 145 mEq/L   Potassium 3.7  3.5 - 5.1 mEq/L   Chloride 100  96 - 112 mEq/L   CO2 21  19 - 32 mEq/L   Glucose, Bld 71  70 - 99 mg/dL   BUN 5 (*) 6 - 23 mg/dL   Creatinine, Ser 9.60  0.50 - 1.10 mg/dL   Calcium 8.9  8.4 - 45.4 mg/dL   Total Protein 6.6  6.0 - 8.3 g/dL   Albumin 2.5 (*) 3.5 - 5.2 g/dL   AST 12  0 - 37 U/L   ALT 8  0 - 35 U/L   Alkaline Phosphatase 85  39 - 117 U/L   Total Bilirubin 0.1 (*) 0.3 - 1.2 mg/dL   GFR calc non Af Amer >90  >90 mL/min   GFR calc Af Amer >90  >90 mL/min  CBC     Status: Abnormal   Collection Time   08/25/12  2:07 PM      Component Value Range   WBC 15.2 (*) 4.0 - 10.5 K/uL   RBC 4.61  3.87 - 5.11 MIL/uL   Hemoglobin 10.1 (*) 12.0 - 15.0 g/dL   HCT 09.8 (*) 11.9 - 14.7 %   MCV 70.3 (*) 78.0 - 100.0 fL   MCH 21.9 (*) 26.0 - 34.0 pg   MCHC 31.2  30.0 - 36.0 g/dL   RDW 82.9  56.2 - 13.0 %   Platelets 141 (*)  150 - 400 K/uL    Imaging:  No results found. MAU Course: - contractions every 3-4 minutes on monitor. FFN negative. Not feeling contractions. Cervix closed without change through MAU course.  - wet prep negative for yeast, BV or trich. GC/CHl pending.  Urine sent for culture given presence of leuks, but no nitrites and  asymptomatic, so will not empirically treat.  - CBC and CMP obtained for concern of pre-Eclampsia. Low platelet at 141 but improved from her baseline in April 2013 of 124. No LFT abnormality. No protein in urine. BP's normal between 108-114/66-83. Treat headache with tylenol.  Patient to follow up here in the next week if cannot be seen by her OB doctor in [place] sooner.  Patient stable for discharge.   Assessment: 1. Headache     Plan: Discharge home Labor precautions and fetal kick counts    Medication List     As of 08/25/2012  8:03 PM        Lonia Skinner, MD 08/25/2012 8:03 PM

## 2012-08-26 LAB — URINE CULTURE: Colony Count: 7000

## 2012-08-26 LAB — GC/CHLAMYDIA PROBE AMP
CT Probe RNA: NEGATIVE
GC Probe RNA: NEGATIVE

## 2012-08-29 NOTE — ED Notes (Signed)
I examined pt and agree with documentation above and resident plan of care. MUHAMMAD,Camy Leder  

## 2013-06-30 ENCOUNTER — Encounter (HOSPITAL_COMMUNITY): Payer: Self-pay | Admitting: *Deleted

## 2014-05-22 ENCOUNTER — Encounter (HOSPITAL_COMMUNITY): Payer: Self-pay | Admitting: *Deleted

## 2018-05-09 ENCOUNTER — Encounter (HOSPITAL_COMMUNITY): Payer: Self-pay

## 2018-05-09 ENCOUNTER — Emergency Department (HOSPITAL_COMMUNITY)
Admission: EM | Admit: 2018-05-09 | Discharge: 2018-05-09 | Disposition: A | Discharge status: Home / Self Care | Payer: No Typology Code available for payment source | Attending: Emergency Medicine | Admitting: Emergency Medicine

## 2018-05-09 ENCOUNTER — Emergency Department (HOSPITAL_COMMUNITY): Payer: No Typology Code available for payment source

## 2018-05-09 DIAGNOSIS — S6992XA Unspecified injury of left wrist, hand and finger(s), initial encounter: Secondary | ICD-10-CM | POA: Insufficient documentation

## 2018-05-09 DIAGNOSIS — Y92410 Unspecified street and highway as the place of occurrence of the external cause: Secondary | ICD-10-CM | POA: Insufficient documentation

## 2018-05-09 DIAGNOSIS — S8001XA Contusion of right knee, initial encounter: Secondary | ICD-10-CM | POA: Diagnosis not present

## 2018-05-09 DIAGNOSIS — Y999 Unspecified external cause status: Secondary | ICD-10-CM | POA: Diagnosis not present

## 2018-05-09 DIAGNOSIS — J45909 Unspecified asthma, uncomplicated: Secondary | ICD-10-CM | POA: Insufficient documentation

## 2018-05-09 DIAGNOSIS — M25571 Pain in right ankle and joints of right foot: Secondary | ICD-10-CM | POA: Insufficient documentation

## 2018-05-09 DIAGNOSIS — S8991XA Unspecified injury of right lower leg, initial encounter: Secondary | ICD-10-CM | POA: Diagnosis present

## 2018-05-09 DIAGNOSIS — Y9389 Activity, other specified: Secondary | ICD-10-CM | POA: Diagnosis not present

## 2018-05-09 MED ORDER — IBUPROFEN 800 MG PO TABS: 800.0000 mg | ORAL_TABLET | Freq: Three times a day (TID) | ORAL | 0 refills | Status: DC | PRN

## 2018-05-09 NOTE — ED Notes (Signed)
Patient transported to X-ray 
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
## 2018-05-09 NOTE — ED Notes (Signed)
Gave pt ice pack to apply to left forearm.

## 2018-05-09 NOTE — Discharge Instructions (Signed)
Return here as needed. Ice to the areas that are sore. The x-rays were normal.  You can expect to be more sore over the next 7 to 10 days.
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
## 2018-06-09 ENCOUNTER — Inpatient Hospital Stay (HOSPITAL_COMMUNITY)
Admission: AD | Admit: 2018-06-09 | Discharge: 2018-06-09 | Disposition: A | Discharge status: Home / Self Care | Payer: Self-pay | Source: Ambulatory Visit | Attending: Obstetrics & Gynecology | Admitting: Obstetrics & Gynecology

## 2018-06-09 ENCOUNTER — Other Ambulatory Visit: Payer: Self-pay

## 2018-06-09 ENCOUNTER — Encounter (HOSPITAL_COMMUNITY): Payer: Self-pay

## 2018-06-09 DIAGNOSIS — Z113 Encounter for screening for infections with a predominantly sexual mode of transmission: Secondary | ICD-10-CM

## 2018-06-09 DIAGNOSIS — N760 Acute vaginitis: Secondary | ICD-10-CM | POA: Insufficient documentation

## 2018-06-09 DIAGNOSIS — F17210 Nicotine dependence, cigarettes, uncomplicated: Secondary | ICD-10-CM | POA: Insufficient documentation

## 2018-06-09 DIAGNOSIS — B9689 Other specified bacterial agents as the cause of diseases classified elsewhere: Secondary | ICD-10-CM | POA: Insufficient documentation

## 2018-06-09 DIAGNOSIS — Z202 Contact with and (suspected) exposure to infections with a predominantly sexual mode of transmission: Secondary | ICD-10-CM | POA: Insufficient documentation

## 2018-06-09 DIAGNOSIS — Z3202 Encounter for pregnancy test, result negative: Secondary | ICD-10-CM | POA: Insufficient documentation

## 2018-06-09 LAB — WET PREP, GENITAL
Sperm: NONE SEEN
TRICH WET PREP: NONE SEEN
Yeast Wet Prep HPF POC: NONE SEEN

## 2018-06-09 LAB — URINALYSIS, ROUTINE W REFLEX MICROSCOPIC
BILIRUBIN URINE: NEGATIVE
Glucose, UA: NEGATIVE mg/dL
KETONES UR: NEGATIVE mg/dL
Leukocytes, UA: NEGATIVE
NITRITE: NEGATIVE
PROTEIN: NEGATIVE mg/dL
Specific Gravity, Urine: 1.003 — ABNORMAL LOW (ref 1.005–1.030)
pH: 8 (ref 5.0–8.0)

## 2018-06-09 LAB — CBC
HEMATOCRIT: 39.3 % (ref 36.0–46.0)
HEMOGLOBIN: 12.4 g/dL (ref 12.0–15.0)
MCH: 23.3 pg — ABNORMAL LOW (ref 26.0–34.0)
MCHC: 31.6 g/dL (ref 30.0–36.0)
MCV: 73.7 fL — AB (ref 80.0–100.0)
NRBC: 0 % (ref 0.0–0.2)
Platelets: 262 10*3/uL (ref 150–400)
RBC: 5.33 MIL/uL — ABNORMAL HIGH (ref 3.87–5.11)
RDW: 14.4 % (ref 11.5–15.5)
WBC: 10.9 10*3/uL — ABNORMAL HIGH (ref 4.0–10.5)

## 2018-06-09 LAB — POCT PREGNANCY, URINE: Preg Test, Ur: NEGATIVE

## 2018-06-09 MED ORDER — METRONIDAZOLE 500 MG PO TABS: 500.0000 mg | ORAL_TABLET | Freq: Two times a day (BID) | ORAL | 0 refills | Status: AC

## 2018-06-09 MED ORDER — AZITHROMYCIN 250 MG PO TABS
1000.0000 mg | ORAL_TABLET | Freq: Once | ORAL | Status: AC
  Administered 2018-06-09: 1000 mg via ORAL
  Filled 2018-06-09: qty 4

## 2018-06-09 NOTE — MAU Note (Signed)
Pt presents to MAU with complaints of lower abdominal and back pain that started two days ago. States she also wants STD testing

## 2018-06-09 NOTE — MAU Provider Note (Signed)
Chief Complaint: STD testing and Abdominal Pain   First Provider Initiated Contact with Patient 06/09/18 1128     SUBJECTIVE HPI: [name] is a 32 y.o. [id number] at Unknown who presents to Maternity Admissions reporting abdominal cramping, vaginal discharge, and chlamydia exposure. Has 1 partner x 4 months. Does not consistently use condoms. Was told by her partner that he tested positive for chlamydia.  Current symptoms started over the weekend. Reports thin white discharge with foul odor. No itching or irritation. Intermittent lower abdominal pain. Denies dysuria, vaginal bleeding, dyspareunia, or postcoital bleeding.  Reports episode of bright red blood in stool and toilet yesterday with BM. Has had issues with constipation recently. Isn't treating constipation. Denies fever/chills or rectal pain. Had BM today without blood. Hx of hemorrhoids & states she was told a few years ago that she has diverticulosis.    Location: lower abdomen Quality: cramping Severity: 5/10 on pain scale Duration: 3 days Timing: intermittnet Modifying factors: nothing makes better or worse. Hasn't treated symptoms Associated signs and symptoms: vaginal discharge, rectal bleeding  Past Medical History:  Diagnosis Date  . Asthma    last attack >15 yrs ago  . Pregnancy induced hypertension   . Preterm labor   . Sickle cell trait (HCC)    OB History  Gravida Para Term Preterm AB Living  8 3 1 2 5 4   SAB TAB Ectopic Multiple Live Births  2 2 1  0 2    # Outcome Date GA Lbr Len/2nd Weight Sex Delivery Anes PTL Lv  8 Ectopic 2017          7 Preterm 2014     CS-Unspec        Birth Comments: System Generated. Please review and update pregnancy details.  6 Term 11/09/11 5433w2d 18:16 / 00:57  F CS-LTranv EPI  LIV     Birth Comments: wnl  5 SAB 2009          4 TAB 2007          3 Preterm 2006 10040w0d    CS-LTranv   LIV     Birth Comments: pre ecclampsia  2 TAB           1 SAB            Past Surgical  History:  Procedure Laterality Date  . CESAREAN SECTION    . CESAREAN SECTION  11/09/2011   Procedure: CESAREAN SECTION;  Surgeon: Purcell NailsAngela Y Roberts, MD;  Location: [facility];  Service: Gynecology;  Laterality: N/A;  Repeat cesarean section of baby girl at 221918 APGAR 9/9  . OVARIAN CYST REMOVAL  2006   Social History   Socioeconomic History  . Marital status: Married    Spouse name: Not on file  . Number of children: Not on file  . Years of education: Not on file  . Highest education level: Not on file  Occupational History  . Not on file  Social Needs  . Financial resource strain: Not on file  . Food insecurity:    Worry: Not on file    Inability: Not on file  . Transportation needs:    Medical: Not on file    Non-medical: Not on file  Tobacco Use  . Smoking status: Current Every Day Smoker    Packs/day: 0.50  . Smokeless tobacco: Never Used  Substance and Sexual Activity  . Alcohol use: No  . Drug use: Yes    Types: Marijuana    Comment: occ  .  Sexual activity: Yes    Birth control/protection: Surgical  Lifestyle  . Physical activity:    Days per week: Not on file    Minutes per session: Not on file  . Stress: Not on file  Relationships  . Social connections:    Talks on phone: Not on file    Gets together: Not on file    Attends religious service: Not on file    Active member of club or organization: Not on file    Attends meetings of clubs or organizations: Not on file    Relationship status: Not on file  . Intimate partner violence:    Fear of current or ex partner: Not on file    Emotionally abused: Not on file    Physically abused: Not on file    Forced sexual activity: Not on file  Other Topics Concern  . Not on file  Social History Narrative  . Not on file   Family History  Problem Relation Age of Onset  . Asthma Mother   . Mental illness Mother        bipolar  . Drug abuse Mother   . Hypertension Maternal Grandmother   . Stroke Maternal  Grandmother   . Asthma Maternal Grandmother   . Diabetes Maternal Grandmother   . Cancer Maternal Grandmother        lung  . Anesthesia problems Neg Hx    No current facility-administered medications on file prior to encounter.    No current outpatient medications on file prior to encounter.   No Known Allergies  I have reviewed patient's Past Medical Hx, Surgical Hx, Family Hx, Social Hx, medications and allergies.   Review of Systems  Constitutional: Negative.   Gastrointestinal: Positive for abdominal pain, anal bleeding, blood in stool and constipation. Negative for abdominal distention, diarrhea, nausea, rectal pain and vomiting.  Genitourinary: Positive for vaginal discharge. Negative for dyspareunia, dysuria and vaginal bleeding.    OBJECTIVE Patient Vitals for the past 24 hrs:  BP Temp Pulse Resp  06/09/18 1242 (!) 137/94 - 63 18  06/09/18 1109 131/76 98.3 F (36.8 C) 71 16   Constitutional: Well-developed, well-nourished female in no acute distress.  Cardiovascular: normal rate & rhythm, no murmur Respiratory: normal rate and effort. Lung sounds clear throughout GI: Abd soft, non-tender, Pos BS x 4. No guarding or rebound tenderness MS: Extremities nontender, no edema, normal ROM Neurologic: Alert and oriented x 4.  GU:     SPECULUM EXAM: NEFG, gray frothy foul smelling discharge. Friable cervix.   BIMANUAL: No CMT.uterus normal size, no adnexal tenderness or masses.         Rectal: no external hemorrhoid. Small (~2 mm) fissure, no active bleeding    LAB RESULTS Results for orders placed or performed during the hospital encounter of 06/09/18 (from the past 24 hour(s))  Urinalysis, Routine w reflex microscopic     Status: Abnormal   Collection Time: 06/09/18 11:31 AM  Result Value Ref Range   Color, Urine STRAW (A) YELLOW   APPearance CLEAR CLEAR   Specific Gravity, Urine 1.003 (L) 1.005 - 1.030   pH 8.0 5.0 - 8.0   Glucose, UA NEGATIVE NEGATIVE mg/dL   Hgb  urine dipstick SMALL (A) NEGATIVE   Bilirubin Urine NEGATIVE NEGATIVE   Ketones, ur NEGATIVE NEGATIVE mg/dL   Protein, ur NEGATIVE NEGATIVE mg/dL   Nitrite NEGATIVE NEGATIVE   Leukocytes, UA NEGATIVE NEGATIVE   RBC / HPF 0-5 0 - 5 RBC/hpf  WBC, UA 0-5 0 - 5 WBC/hpf   Bacteria, UA RARE (A) NONE SEEN   Squamous Epithelial / LPF 0-5 0 - 5   Mucus PRESENT   Pregnancy, urine POC     Status: None   Collection Time: 06/09/18 11:36 AM  Result Value Ref Range   Preg Test, Ur NEGATIVE NEGATIVE  Wet prep, genital     Status: Abnormal   Collection Time: 06/09/18 11:37 AM  Result Value Ref Range   Yeast Wet Prep HPF POC NONE SEEN NONE SEEN   Trich, Wet Prep NONE SEEN NONE SEEN   Clue Cells Wet Prep HPF POC PRESENT (A) NONE SEEN   WBC, Wet Prep HPF POC FEW (A) NONE SEEN   Sperm NONE SEEN   CBC     Status: Abnormal   Collection Time: 06/09/18 11:45 AM  Result Value Ref Range   WBC 10.9 (H) 4.0 - 10.5 K/uL   RBC 5.33 (H) 3.87 - 5.11 MIL/uL   Hemoglobin 12.4 12.0 - 15.0 g/dL   HCT 16.1 09.6 - 04.5 %   MCV 73.7 (L) 80.0 - 100.0 fL   MCH 23.3 (L) 26.0 - 34.0 pg   MCHC 31.6 30.0 - 36.0 g/dL   RDW 40.9 81.1 - 91.4 %   Platelets 262 150 - 400 K/uL   nRBC 0.0 0.0 - 0.2 %    IMAGING No results found.  MAU COURSE Orders Placed This Encounter  Procedures  . Wet prep, genital  . Urinalysis, Routine w reflex microscopic  . CBC  . Pregnancy, urine POC  . Discharge patient   Meds ordered this encounter  Medications  . azithromycin (ZITHROMAX) tablet 1,000 mg  . metroNIDAZOLE (FLAGYL) 500 MG tablet    Sig: Take 1 tablet (500 mg total) by mouth 2 (two) times daily.    Dispense:  14 tablet    Refill:  0    Order Specific Question:   Supervising Provider    Answer:   Jaynie Collins A [[id number]]    MDM Negative UPT VSS, NAD Azithromycin given for chlamydia exposure GC/CT, wet prep, HIV, RPR collected Rectal bleeding likely d/t fissure. Abdomen soft & non tender. Hgb stable. Discussed tx  for constipation. Provided resources to establish with PCP  ASSESSMENT 1. Exposure to chlamydia   2. Screen for STD (sexually transmitted disease)   3. BV (bacterial vaginosis)     PLAN Discharge home in stable condition. No intercourse x 2 weeks Rx flagyl GC/CT, HIV, RPR pending F/u with PCP Go to ED for worsening abdominal pain, rectal bleeding, and/or associated fever  Allergies as of 06/09/2018   No Known Allergies     Medication List    STOP taking these medications   ibuprofen 800 MG tablet Commonly known as:  ADVIL,MOTRIN     TAKE these medications   metroNIDAZOLE 500 MG tablet Commonly known as:  FLAGYL Take 1 tablet (500 mg total) by mouth 2 (two) times daily.        Judeth Horn, NP 06/09/2018  10:14 PM

## 2018-06-09 NOTE — Discharge Instructions (Signed)
No Primary Care Doctor:  To locate a primary care doctor that accepts your insurance or provides certain services:           [employer] Connect: [phone]           Physician Referral Service: [phone] ask for My [place]  If no insurance, you need to see if you qualify for [facility] orange card, call to set      up appointment for eligibility/enrollment at [phone] or [phone] or visit [facility] of Health and [employer] ([address], [place] and [address] -[place]) to meet with a [facility] enrollment specialist.  Agencies that provide inexpensive (sliding fee scale) medical care:       [facility] - Family Medicine at [place] - [phone]     [facility]  -  [facility] [phone]     [facility] - [phone]     [facility] Care & Wellness - [phone]     [facility] [phone]     [facility] Family Practice [phone]  [facility] - [facility] Child Health @ [place] 820 573 [phone]  [facility] - [facility] @ [place] - [phone]  [facility] Family Practice: [phone]   Women's Clinic: [phone]   Planned Parenthood: [phone]   [facility] [place]    Medicaid-accepting [facility] Providers:           [facility] [phone] (No Family Planning accepted)          [address], [phone], Mon-Fri 9am-5pm          [facility] - [phone]  [address] [place], [address] [place], Mon-Thursday 8am-5pm, Fri 8am-noon  [employer] - [phone]          [address], [address], Mon-Fri 7:30am-4:30pm          [facility] - [phone]          [address], [place] 8am-5pm          [facility] -  [phone] [address], [address]          Only accepts [place] [employer] patients after they have their name applied to their card  Self Pay (no insurance) in [facility]:           Sickle Cell Patients:   [address] [place], [phone] [facility] Internal Medicine:  [address] [place] [phone]       [facility]  [address] [place] [phone]  [facility] Family Practice:  [address], [phone]          [facility]           [address] [place], [phone] [facility]  [address] [place], [phone]           [facility]           [address], [place] [phone]        [place]  [facility] - [address]           [phone], Mon-Fri 9am-7pm, [place] 9am-1pm          [facility] - Family Medicine @ [facility]          [address] [place], [phone]          [facility] - [facility]           [address] [phone] [facility] - [facility]  [address] [place] [phone]          [facility]           [address], [phone]  [facility] - [facility]   [address] [place], [place] [phone] [facility] - [facility]  [address], [phone]  Dr. Julio Sicks           [address], [place], [phone]          [facility]           [address], [phone]          [facility]             [address], [phone]          [facility]           [address] [place], [phone], 1st & 3rd Saturday every month, 10am-1pm OTHERS:  [employer]  (Immigration [employer] Only)  [phone] (Thursday  only) Strategies for finding a Primary Care Provider:  1) Find a Doctor and Pay Out of Pocket  Although you won't have to find out who is covered by your insurance plan, it is a good idea to ask around and get recommendations. You will then need to call the office and see if the doctor you have chosen will accept you as a new patient and what types of options they offer for patients who are self-pay. Some doctors offer discounts or will set up payment plans for their patients who do not have insurance, but you will need to ask so you aren't surprised when you get to your appointment.  2) Contact [place] [employer] - To see if you qualify for orange card access to healthcare safety net providers.  Call for appointment for eligibility/enrollment at [phone] or [phone]. (Uninsured, 0-200% FPL, qualifying info)  Applicants for [facility] are first required to see if they are eligible to enroll in the [facility] Marketplace before enrolling in [facility] (and get an exemption if they are not).  GCCN Criteria for acceptance is:  ? Proof of [employer] exemption - form or documentation  ? Valid photo ID (driver's license, state identification card, passport, home country ID)  ? Proof of [facility] residency (e.g. drivers license, lease/landlord information, pay stubs with address, utility bill, bank statement, etc.)  ? Proof of income (1040, last year's tax return, W2, 4 current pay stubs, other income proof)  ? Proof of assets (current bank statement + 3 most recent, disability paperwork, life insurance info, tax value on autos, etc.)  3) Contact Your Local Health Department  Not all health departments have doctors that can see patients for sick visits, but many do, so it is worth a call  to see if yours does. If you don't know where your local health department is, you can check in your phone book. The CDC also has a tool to help you locate your state's health department, and many state  websites also have listings of all of their local health departments.  4) Find a Walk-in Clinic  If your illness is not likely to be very severe or complicated, you may want to try a walk in clinic. These are popping up all over the country in pharmacies, drugstores, and shopping centers. They're usually staffed by nurse practitioners or physician assistants that have been trained to treat common illnesses and complaints. They're usually fairly quick and inexpensive. However, if you have serious medical issues or chronic medical problems, these are probably not your best option   STD Testing:           [facility] [place] [place] [facility]           [address], [place], phone [phone] or [phone]           Monday - Friday, call for an appointment          [facility] Department of [facility], [place] [facility]           [address], [place], phone [phone] or [phone]           Monday - Friday, call for an appointment       Constipation, Adult Constipation is when a person has fewer bowel movements in a week than normal, has difficulty having a bowel movement, or has stools that are dry, hard, or larger than normal. Constipation may be caused by an underlying condition. It may become worse with age if a person takes certain medicines and does not take in enough fluids. Follow these instructions at home: Eating and drinking   Eat foods that have a lot of fiber, such as fresh fruits and vegetables, whole grains, and beans.  Limit foods that are high in fat, low in fiber, or overly processed, such as french fries, hamburgers, cookies, candies, and soda.  Drink enough fluid to keep your urine clear or pale yellow. General instructions  Exercise regularly or as told by your health care provider.  Go to the restroom when you have the urge to go. Do not hold it in.  Take over-the-counter and prescription medicines  only as told by your health care provider. These include any fiber supplements.  Practice pelvic floor retraining exercises, such as deep breathing while relaxing the lower abdomen and pelvic floor relaxation during bowel movements.  Watch your condition for any changes.  Keep all follow-up visits as told by your health care provider. This is important. Contact a health care provider if:  You have pain that gets worse.  You have a fever.  You do not have a bowel movement after 4 days.  You vomit.  You are not hungry.  You lose weight.  You are bleeding from the anus.  You have thin, pencil-like stools. Get help right away if:  You have a fever and your symptoms suddenly get worse.  You leak stool or have blood in your stool.  Your abdomen is bloated.  You have severe pain in your abdomen.  You feel dizzy or you faint. This information is not intended to replace advice given to you by your health care provider. Make sure you discuss any questions you have with your health  care provider. Document Released: 04/04/2004 Document Revised: 01/25/2016 Document Reviewed: 12/26/2015 Elsevier Interactive Patient Education  2018 [employer]     Chlamydia, Female Chlamydia is an STD (sexually transmitted disease). This is an infection that spreads through sexual contact. If it is not treated, it can cause serious problems. It must be treated with antibiotic medicine. Sometimes, you may not have symptoms (asymptomatic). When you have symptoms, they can include:  Burning when you pee (urinate).  Peeing often.  Fluid (discharge) coming from the vagina.  Redness, soreness, and swelling (inflammation) of the butt (rectum).  Bleeding or fluid coming from the butt.  Belly (abdominal) pain.  Pain during sex.  Bleeding between periods.  Itching, burning, or redness in the eyes.  Fluid coming from the eyes.  Follow these instructions at home: Medicines  Take  over-the-counter and prescription medicines only as told by your doctor.  Take your antibiotic medicine as told by your doctor. Do not stop taking the antibiotic even if you start to feel better. Sexual activity  Tell sex partners about your infection. Sex partners are people you had oral, anal, or vaginal sex with within 60 days of when you started getting sick. They need treatment, too.  Do not have sex until: ? You and your sex partners have been treated. ? Your doctor says it is okay.  If you have a single dose treatment, wait 7 days before having sex. General instructions  It is up to you to get your test results. Ask your doctor when your results will be ready.  Get a lot of rest.  Eat healthy foods.  Drink enough fluid to keep your pee (urine) clear or pale yellow.  Keep all follow-up visits as told by your doctor. You may need tests after 3 months. Preventing chlamydia  The only way to prevent chlamydia is not to have sex. To lower your risk: ? Use latex condoms correctly. Do this every time you have sex. ? Avoid having many sex partners. ? Ask if your partner has been tested for STDs and if he or she had negative results. Contact a doctor if:  You get new symptoms.  You do not get better with treatment.  You have a fever or chills.  You have pain during sex. Get help right away if:  Your pain gets worse and does not get better with medicine.  You get flu-like symptoms, such as: ? Night sweats. ? Sore throat. ? Muscle aches.  You feel sick to your stomach (nauseous).  You throw up (vomit).  You have trouble swallowing.  You have bleeding: ? Between periods. ? After sex.  You have irregular periods.  You have belly pain that does not get better with medicine.  You have lower back pain that does not get better with medicine.  You feel weak or dizzy.  You pass out (faint).  You are pregnant and you get symptoms of  chlamydia. Summary  Chlamydia is an infection that spreads through sexual contact.  Sometimes, chlamydia can cause no symptoms (asymptomatic).  Do not have sex until your doctor says it is okay.  All sex partners will have to be treated for chlamydia. This information is not intended to replace advice given to you by your health care provider. Make sure you discuss any questions you have with your health care provider. Document Released: 04/15/2008 Document Revised: 06/26/2016 Document Reviewed: 06/26/2016 Elsevier Interactive Patient Education  2017 Elsevier Inc.      Bacterial Vaginosis Bacterial  vaginosis is an infection of the vagina. It happens when too many germs (bacteria) grow in the vagina. This infection puts you at risk for infections from sex (STIs). Treating this infection can lower your risk for some STIs. You should also treat this if you are pregnant. It can cause your baby to be born early. Follow these instructions at home: Medicines  Take over-the-counter and prescription medicines only as told by your doctor.  Take or use your antibiotic medicine as told by your doctor. Do not stop taking or using it even if you start to feel better. General instructions  If you your sexual partner is a woman, tell her that you have this infection. She needs to get treatment if she has symptoms. If you have a female partner, he does not need to be treated.  During treatment: ? Avoid sex. ? Do not douche. ? Avoid alcohol as told. ? Avoid breastfeeding as told.  Drink enough fluid to keep your pee (urine) clear or pale yellow.  Keep your vagina and butt (rectum) clean. ? Wash the area with warm water every day. ? Wipe from front to back after you use the toilet.  Keep all follow-up visits as told by your doctor. This is important. Preventing this condition  Do not douche.  Use only warm water to wash around your vagina.  Use protection when you have sex. This  includes: ? Latex condoms. ? Dental dams.  Limit how many people you have sex with. It is best to only have sex with the same person (be monogamous).  Get tested for STIs. Have your partner get tested.  Wear underwear that is cotton or lined with cotton.  Avoid tight pants and pantyhose. This is most important in summer.  Do not use any products that have nicotine or tobacco in them. These include cigarettes and e-cigarettes. If you need help quitting, ask your doctor.  Do not use illegal drugs.  Limit how much alcohol you drink. Contact a doctor if:  Your symptoms do not get better, even after you are treated.  You have more discharge or pain when you pee (urinate).  You have a fever.  You have pain in your belly (abdomen).  You have pain with sex.  Your bleed from your vagina between periods. Summary  This infection happens when too many germs (bacteria) grow in the vagina.  Treating this condition can lower your risk for some infections from sex (STIs).  You should also treat this if you are pregnant. It can cause early (premature) birth.  Do not stop taking or using your antibiotic medicine even if you start to feel better. This information is not intended to replace advice given to you by your health care provider. Make sure you discuss any questions you have with your health care provider. Document Released: 04/15/2008 Document Revised: 03/22/2016 Document Reviewed: 03/22/2016 Elsevier Interactive Patient Education  2017 [employer].

## 2018-06-10 LAB — GC/CHLAMYDIA PROBE AMP (~~LOC~~) NOT AT ARMC
CHLAMYDIA, DNA PROBE: NEGATIVE
NEISSERIA GONORRHEA: NEGATIVE

## 2018-06-10 LAB — HIV ANTIBODY (ROUTINE TESTING W REFLEX): HIV SCREEN 4TH GENERATION: NONREACTIVE

## 2018-06-10 LAB — RPR: RPR: NONREACTIVE

## 2018-07-15 ENCOUNTER — Other Ambulatory Visit: Payer: Self-pay

## 2018-07-15 ENCOUNTER — Emergency Department (HOSPITAL_COMMUNITY): Payer: Self-pay

## 2018-07-15 ENCOUNTER — Emergency Department (HOSPITAL_COMMUNITY)
Admission: EM | Admit: 2018-07-15 | Discharge: 2018-07-15 | Disposition: A | Discharge status: Home / Self Care | Payer: Self-pay | Attending: Emergency Medicine | Admitting: Emergency Medicine

## 2018-07-15 ENCOUNTER — Encounter (HOSPITAL_COMMUNITY): Payer: Self-pay | Admitting: Emergency Medicine

## 2018-07-15 DIAGNOSIS — Y999 Unspecified external cause status: Secondary | ICD-10-CM | POA: Insufficient documentation

## 2018-07-15 DIAGNOSIS — S62647A Nondisplaced fracture of proximal phalanx of left little finger, initial encounter for closed fracture: Secondary | ICD-10-CM | POA: Insufficient documentation

## 2018-07-15 DIAGNOSIS — Y929 Unspecified place or not applicable: Secondary | ICD-10-CM | POA: Insufficient documentation

## 2018-07-15 DIAGNOSIS — F1290 Cannabis use, unspecified, uncomplicated: Secondary | ICD-10-CM | POA: Insufficient documentation

## 2018-07-15 DIAGNOSIS — F17210 Nicotine dependence, cigarettes, uncomplicated: Secondary | ICD-10-CM | POA: Insufficient documentation

## 2018-07-15 DIAGNOSIS — Y9383 Activity, rough housing and horseplay: Secondary | ICD-10-CM | POA: Insufficient documentation

## 2018-07-15 DIAGNOSIS — X500XXA Overexertion from strenuous movement or load, initial encounter: Secondary | ICD-10-CM | POA: Insufficient documentation

## 2018-07-15 MED ORDER — IBUPROFEN 800 MG PO TABS
800.0000 mg | ORAL_TABLET | Freq: Once | ORAL | Status: AC
  Administered 2018-07-15: 800 mg via ORAL
  Filled 2018-07-15: qty 1

## 2018-07-15 MED ORDER — ACETAMINOPHEN 500 MG PO TABS
1000.0000 mg | ORAL_TABLET | Freq: Once | ORAL | Status: AC
  Administered 2018-07-15: 1000 mg via ORAL
  Filled 2018-07-15: qty 2

## 2018-07-15 NOTE — ED Notes (Signed)
Patient transported to X-ray 

## 2018-07-15 NOTE — Discharge Instructions (Addendum)
Follow up with your family doc.  Use buddy tape as needed.    Take 4 over the counter ibuprofen tablets 3 times a day or 2 over-the-counter naproxen tablets twice a day for pain. Also take tylenol 1000mg (2 extra strength) four times a day.

## 2018-07-15 NOTE — ED Provider Notes (Signed)
[facility] EMERGENCY DEPARTMENT Provider Note   CSN: [id number] Arrival date & time: 07/15/18  1100     History   Chief Complaint Chief Complaint  Patient presents with  . Hand Injury    HPI [name] is a 32 y.o. female.  32 yo F with a cc of left pinkie pain.  Patient was "messing around' with her brother and got her pinkie finger hyperextended while trying to grab a trash bag.    The history is provided by the patient.  Injury  This is a new problem. The current episode started yesterday. The problem occurs constantly. The problem has not changed since onset.Pertinent negatives include no chest pain, no abdominal pain, no headaches and no shortness of breath. The symptoms are aggravated by bending. Nothing relieves the symptoms. She has tried nothing for the symptoms. The treatment provided no relief.    Past Medical History:  Diagnosis Date  . Asthma    last attack >15 yrs ago  . Pregnancy induced hypertension   . Preterm labor   . Sickle cell trait [facility])     Patient Active Problem List   Diagnosis Date Noted  . Failed attempted vaginal birth after previous cesarean delivery 11/12/2011  . Status post repeat low transverse cesarean section 11/12/2011  . H/O cesarean section 10/31/2011    Past Surgical History:  Procedure Laterality Date  . CESAREAN SECTION    . CESAREAN SECTION  11/09/2011   Procedure: CESAREAN SECTION;  Surgeon: Purcell Nails, MD;  Location: [facility];  Service: Gynecology;  Laterality: N/A;  Repeat cesarean section of baby girl at 71 APGAR 9/9  . OVARIAN CYST REMOVAL  2006     OB History    Gravida  8   Para  3   Term  1   Preterm  2   AB  5   Living  4     SAB  2   TAB  2   Ectopic  1   Multiple  0   Live Births  2            Home Medications    Prior to Admission medications   Medication Sig Start Date End Date Taking? Authorizing Provider  metroNIDAZOLE (FLAGYL) 500 MG tablet Take 1  tablet (500 mg total) by mouth 2 (two) times daily. 06/09/18   Judeth Horn, NP    Family History Family History  Problem Relation Age of Onset  . Asthma Mother   . Mental illness Mother        bipolar  . Drug abuse Mother   . Hypertension Maternal Grandmother   . Stroke Maternal Grandmother   . Asthma Maternal Grandmother   . Diabetes Maternal Grandmother   . Cancer Maternal Grandmother        lung  . Anesthesia problems Neg Hx     Social History Social History   Tobacco Use  . Smoking status: Current Every Day Smoker    Packs/day: 0.50  . Smokeless tobacco: Never Used  Substance Use Topics  . Alcohol use: Yes    Comment: occasional  . Drug use: Yes    Types: Marijuana    Comment: occ     Allergies   Patient has no known allergies.   Review of Systems Review of Systems  Constitutional: Negative for chills and fever.  HENT: Negative for congestion and rhinorrhea.   Eyes: Negative for redness and visual disturbance.  Respiratory: Negative for shortness  of breath and wheezing.   Cardiovascular: Negative for chest pain and palpitations.  Gastrointestinal: Negative for abdominal pain, nausea and vomiting.  Genitourinary: Negative for dysuria and urgency.  Musculoskeletal: Positive for arthralgias. Negative for myalgias.  Skin: Negative for pallor and wound.  Neurological: Negative for dizziness and headaches.     Physical Exam Updated Vital Signs BP 123/88   Pulse 65   Temp 98.4 F (36.9 C) (Oral)   Resp 16   LMP 07/13/2018   SpO2 100%   Physical Exam Vitals signs and nursing note reviewed.  Constitutional:      General: She is not in acute distress.    Appearance: She is well-developed. She is not diaphoretic.  HENT:     Head: Normocephalic and atraumatic.  Eyes:     Pupils: Pupils are equal, round, and reactive to light.  Neck:     Musculoskeletal: Normal range of motion and neck supple.  Cardiovascular:     Rate and Rhythm: Normal rate and  regular rhythm.     Heart sounds: No murmur. No friction rub. No gallop.   Pulmonary:     Effort: Pulmonary effort is normal.     Breath sounds: No wheezing or rales.  Abdominal:     General: There is no distension.     Palpations: Abdomen is soft.     Tenderness: There is no abdominal tenderness.  Musculoskeletal:        General: Swelling and tenderness present. No deformity.     Comments: Focalized swelling to the proximal phalanx. Painful bending.    Skin:    General: Skin is warm and dry.  Neurological:     Mental Status: She is alert and oriented to person, place, and time.  Psychiatric:        Behavior: Behavior normal.      ED Treatments / Results  Labs (all labs ordered are listed, but only abnormal results are displayed) Labs Reviewed - No data to display  EKG None  Radiology Dg Finger Little Left  Result Date: 07/15/2018 CLINICAL DATA:  Little finger injury with pain. EXAM: LEFT LITTLE FINGER 2+V COMPARISON:  None. FINDINGS: Three views study shows volar plate avulsion fracture from the base of the middle phalanx. No subluxation or dislocation. No worrisome lytic or sclerotic osseous abnormality. IMPRESSION: Volar plate avulsion fracture from the base of the middle phalanx. Electronically Signed   By: Kennith CenterEric  Mansell M.D.   On: 07/15/2018 12:13    Procedures Procedures (including critical care time)  Medications Ordered in ED Medications  acetaminophen (TYLENOL) tablet 1,000 mg (1,000 mg Oral Given 07/15/18 1217)  ibuprofen (ADVIL,MOTRIN) tablet 800 mg (800 mg Oral Given 07/15/18 1217)     Initial Impression / Assessment and Plan / ED Course  I have reviewed the triage vital signs and the nursing notes.  Pertinent labs & imaging results that were available during my care of the patient were reviewed by me and considered in my medical decision making (see chart for details).     32 yo F with a cc of pinkie finger pain.  Mild swelling, painful bending.   Will obtain plain film.  Plain film with fracture as viewed by me.  Nondisplaced.  Buddy tape.  PCP follow-up.  As the patient does not have a PCP I counseled her on the importance of having 1 and gave her orthopedic follow-up as needed.  1:33 PM:  I have discussed the diagnosis/risks/treatment options with the patient and believe the pt  to be eligible for discharge home to follow-up with PCP, Ortho. We also discussed returning to the ED immediately if new or worsening sx occur. We discussed the sx which are most concerning (e.g., sudden worsening pain, fever, inability to tolerate by mouth) that necessitate immediate return. Medications administered to the patient during their visit and any new prescriptions provided to the patient are listed below.  Medications given during this visit Medications  acetaminophen (TYLENOL) tablet 1,000 mg (1,000 mg Oral Given 07/15/18 1217)  ibuprofen (ADVIL,MOTRIN) tablet 800 mg (800 mg Oral Given 07/15/18 1217)      The patient appears reasonably screen and/or stabilized for discharge and I doubt any other medical condition or other [facility] requiring further screening, evaluation, or treatment in the ED at this time prior to discharge.    Final Clinical Impressions(s) / ED Diagnoses   Final diagnoses:  Closed nondisplaced fracture of proximal phalanx of left little finger, initial encounter    ED Discharge Orders    None       Melene PlanFloyd, Gaspare Netzel, DO 07/15/18 1333

## 2018-07-15 NOTE — ED Notes (Signed)
Patient verbalizes understanding of discharge instructions. Opportunity for questioning and answers were provided. 
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
## 2019-04-15 ENCOUNTER — Ambulatory Visit (INDEPENDENT_AMBULATORY_CARE_PROVIDER_SITE_OTHER): Payer: Self-pay | Admitting: Podiatry

## 2019-04-15 DIAGNOSIS — Z5329 Procedure and treatment not carried out because of patient's decision for other reasons: Secondary | ICD-10-CM

## 2019-04-15 NOTE — Progress Notes (Signed)
No show for appt. 
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
## 2022-07-09 ENCOUNTER — Encounter (HOSPITAL_BASED_OUTPATIENT_CLINIC_OR_DEPARTMENT_OTHER): Payer: Self-pay

## 2022-07-09 ENCOUNTER — Emergency Department (HOSPITAL_BASED_OUTPATIENT_CLINIC_OR_DEPARTMENT_OTHER): Payer: Self-pay

## 2022-07-09 ENCOUNTER — Other Ambulatory Visit: Payer: Self-pay

## 2022-07-09 ENCOUNTER — Emergency Department (HOSPITAL_BASED_OUTPATIENT_CLINIC_OR_DEPARTMENT_OTHER)
Admission: EM | Admit: 2022-07-09 | Discharge: 2022-07-09 | Disposition: A | Discharge status: Home / Self Care | Payer: Self-pay | Attending: Emergency Medicine | Admitting: Emergency Medicine

## 2022-07-09 DIAGNOSIS — W228XXA Striking against or struck by other objects, initial encounter: Secondary | ICD-10-CM | POA: Insufficient documentation

## 2022-07-09 DIAGNOSIS — S62660A Nondisplaced fracture of distal phalanx of right index finger, initial encounter for closed fracture: Secondary | ICD-10-CM | POA: Insufficient documentation

## 2022-07-09 NOTE — ED Triage Notes (Signed)
Pt states that she "snagged" her right index finger on her keys yesterday. Pt reports that she has had increased pain, bruising and swelling today.

## 2022-07-09 NOTE — Discharge Instructions (Signed)
Splint for protection. Motrin and Tylenol as needed as directed. Can apply ice for 20 min at a time. Follow up with hand specialist for recheck, call to schedule appointment.

## 2022-07-09 NOTE — ED Notes (Signed)
Written and verbal inst to pt  Verbalized an understanding  To home  

## 2022-07-09 NOTE — ED Provider Notes (Signed)
[facility] EMERGENCY DEPARTMENT Provider Note   CSN: [id number] Arrival date & time: 07/09/22  2102     History  Chief Complaint  Patient presents with   Finger Injury    [name] is a 36 y.o. female.  36 year old right-hand-dominant female presents with concern for pain and swelling to her right index finger.  Patient states that she is not exactly sure what she did injure her finger but recalls yesterday going to grab her keys which were stuck and pulled on her finger briefly.  No other injuries, complaints, concerns.       Home Medications Prior to Admission medications   Medication Sig Start Date End Date Taking? Authorizing Provider  metroNIDAZOLE (FLAGYL) 500 MG tablet Take 1 tablet (500 mg total) by mouth 2 (two) times daily. 06/09/18   Judeth Horn, NP      Allergies    Patient has no known allergies.    Review of Systems   Review of Systems Negative except as per HPI Physical Exam Updated Vital Signs BP (!) 158/113 (BP Location: Left Arm)   Pulse 91   Temp 98 F (36.7 C)   Resp 18   Ht 5\' 4"  (1.626 m)   Wt 69.4 kg   LMP 06/16/2022 (Exact Date)   SpO2 100%   BMI 26.26 kg/m  Physical Exam Vitals and nursing note reviewed.  Constitutional:      General: She is not in acute distress.    Appearance: She is well-developed. She is not diaphoretic.  HENT:     Head: Normocephalic and atraumatic.  Pulmonary:     Effort: Pulmonary effort is normal.  Musculoskeletal:        General: Swelling and tenderness present.     Comments: Swelling to the right index finger at the DIP and distal phalanx.  Range of motion limited secondary to swelling and pain.  Does have mild amount of ecchymosis in the area.  Brisk capillary refill present.  Sensation intact.  Skin:    General: Skin is warm and dry.     Findings: Bruising present. No erythema or rash.  Neurological:     Mental Status: She is alert and oriented to person, place, and time.      Sensory: No sensory deficit.  Psychiatric:        Behavior: Behavior normal.     ED Results / Procedures / Treatments   Labs (all labs ordered are listed, but only abnormal results are displayed) Labs Reviewed - No data to display  EKG None  Radiology DG Hand Complete Right  Result Date: 07/09/2022 CLINICAL DATA:  Right second finger injury with pain, swelling, and bruising. EXAM: RIGHT HAND - COMPLETE 3+ VIEW COMPARISON:  None Available. FINDINGS: There is a minimally displaced fracture at the base of the distal phalanx of the second digit with intra-articular extension. The remaining bony structures are intact. No dislocation. Soft tissue swelling is present at the distal second digit. IMPRESSION: Minimally displaced fracture of the base of the distal phalanx of the second digit with intra-articular extension. Electronically Signed   By: 07/11/2022 M.D.   On: 07/09/2022 21:30    Procedures Procedures    Medications Ordered in ED Medications - No data to display  ED Course/ Medical Decision Making/ A&P                           Medical Decision Making Amount and/or Complexity of  Data Reviewed Radiology: ordered.   36 year old right-hand-dominant female with right index finger injury as above.  Found to have pain and swelling to the DIP and distal phalanx.  X-ray of the right hand as ordered interpreted myself shows minimally displaced fracture at the base of the distal phalanx of the second digit with intra-articular extension.  Agree with radiologist interpretation.  Patient is placed in a finger splint.  Advised to apply ice and elevate for 20 minutes at a time as needed.  Can take Motrin Tylenol as needed strict for pain.  Follow-up with hand Ortho, referral provided.        Final Clinical Impression(s) / ED Diagnoses Final diagnoses:  Closed nondisplaced fracture of distal phalanx of right index finger, initial encounter    Rx / DC Orders ED Discharge Orders      None         Jeannie Fend, PA-C 07/09/22 2137    Elayne Snare K, DO 07/09/22 2318

## 2023-02-02 DIAGNOSIS — F4310 Post-traumatic stress disorder, unspecified: Secondary | ICD-10-CM | POA: Diagnosis not present

## 2023-02-02 DIAGNOSIS — F4323 Adjustment disorder with mixed anxiety and depressed mood: Secondary | ICD-10-CM | POA: Diagnosis not present

## 2023-02-02 DIAGNOSIS — F3111 Bipolar disorder, current episode manic without psychotic features, mild: Secondary | ICD-10-CM | POA: Diagnosis not present

## 2023-02-16 DIAGNOSIS — F4310 Post-traumatic stress disorder, unspecified: Secondary | ICD-10-CM | POA: Diagnosis not present

## 2023-02-16 DIAGNOSIS — F3111 Bipolar disorder, current episode manic without psychotic features, mild: Secondary | ICD-10-CM | POA: Diagnosis not present

## 2023-02-16 DIAGNOSIS — F4323 Adjustment disorder with mixed anxiety and depressed mood: Secondary | ICD-10-CM | POA: Diagnosis not present

## 2023-03-03 DIAGNOSIS — I10 Essential (primary) hypertension: Secondary | ICD-10-CM | POA: Diagnosis not present

## 2023-03-03 DIAGNOSIS — E559 Vitamin D deficiency, unspecified: Secondary | ICD-10-CM | POA: Diagnosis not present

## 2023-03-03 DIAGNOSIS — R7303 Prediabetes: Secondary | ICD-10-CM | POA: Diagnosis not present

## 2023-03-03 DIAGNOSIS — Z0000 Encounter for general adult medical examination without abnormal findings: Secondary | ICD-10-CM | POA: Diagnosis not present

## 2023-03-06 DIAGNOSIS — F4323 Adjustment disorder with mixed anxiety and depressed mood: Secondary | ICD-10-CM | POA: Diagnosis not present

## 2023-03-06 DIAGNOSIS — F4310 Post-traumatic stress disorder, unspecified: Secondary | ICD-10-CM | POA: Diagnosis not present

## 2023-03-06 DIAGNOSIS — F3111 Bipolar disorder, current episode manic without psychotic features, mild: Secondary | ICD-10-CM | POA: Diagnosis not present
